# Patient Record
Sex: Female | Born: 1970 | Race: White | Hispanic: No | Marital: Married | Smoking: Never smoker
Health system: Southern US, Community
[De-identification: ages and names within clinical notes are randomized; demographics above are authoritative.]

## PROBLEM LIST (undated history)

## (undated) DIAGNOSIS — Z8489 Family history of other specified conditions: Secondary | ICD-10-CM

## (undated) DIAGNOSIS — D259 Leiomyoma of uterus, unspecified: Secondary | ICD-10-CM

## (undated) DIAGNOSIS — IMO0002 Reserved for concepts with insufficient information to code with codable children: Secondary | ICD-10-CM

## (undated) DIAGNOSIS — J3089 Other allergic rhinitis: Secondary | ICD-10-CM

## (undated) DIAGNOSIS — Z85828 Personal history of other malignant neoplasm of skin: Secondary | ICD-10-CM

## (undated) DIAGNOSIS — Z9109 Other allergy status, other than to drugs and biological substances: Secondary | ICD-10-CM

## (undated) DIAGNOSIS — F419 Anxiety disorder, unspecified: Secondary | ICD-10-CM

## (undated) DIAGNOSIS — N921 Excessive and frequent menstruation with irregular cycle: Secondary | ICD-10-CM

## (undated) DIAGNOSIS — M8580 Other specified disorders of bone density and structure, unspecified site: Secondary | ICD-10-CM

## (undated) DIAGNOSIS — T7840XA Allergy, unspecified, initial encounter: Secondary | ICD-10-CM

## (undated) DIAGNOSIS — C4491 Basal cell carcinoma of skin, unspecified: Secondary | ICD-10-CM

## (undated) HISTORY — DX: Other specified disorders of bone density and structure, unspecified site: M85.80

## (undated) HISTORY — DX: Anxiety disorder, unspecified: F41.9

## (undated) HISTORY — DX: Basal cell carcinoma of skin, unspecified: C44.91

## (undated) HISTORY — PX: COLONOSCOPY WITH PROPOFOL: SHX5780

## (undated) HISTORY — DX: Allergy, unspecified, initial encounter: T78.40XA

## (undated) HISTORY — DX: Reserved for concepts with insufficient information to code with codable children: IMO0002

## (undated) HISTORY — DX: Other allergy status, other than to drugs and biological substances: Z91.09

---

## 2000-03-23 ENCOUNTER — Inpatient Hospital Stay (HOSPITAL_COMMUNITY): Admission: AD | Admit: 2000-03-23 | Discharge: 2000-03-23 | Payer: Self-pay | Admitting: Obstetrics and Gynecology

## 2000-05-03 ENCOUNTER — Inpatient Hospital Stay (HOSPITAL_COMMUNITY): Admission: AD | Admit: 2000-05-03 | Discharge: 2000-05-05 | Payer: Self-pay | Admitting: *Deleted

## 2000-05-07 ENCOUNTER — Inpatient Hospital Stay (HOSPITAL_COMMUNITY): Admission: AD | Admit: 2000-05-07 | Discharge: 2000-05-07 | Payer: Self-pay | Admitting: *Deleted

## 2000-05-10 ENCOUNTER — Encounter: Admission: RE | Admit: 2000-05-10 | Discharge: 2000-08-08 | Payer: Self-pay | Admitting: Obstetrics and Gynecology

## 2001-07-03 ENCOUNTER — Other Ambulatory Visit: Admission: RE | Admit: 2001-07-03 | Discharge: 2001-07-03 | Payer: Self-pay | Admitting: Obstetrics and Gynecology

## 2002-07-15 ENCOUNTER — Other Ambulatory Visit: Admission: RE | Admit: 2002-07-15 | Discharge: 2002-07-15 | Payer: Self-pay | Admitting: Obstetrics and Gynecology

## 2003-08-27 ENCOUNTER — Other Ambulatory Visit: Admission: RE | Admit: 2003-08-27 | Discharge: 2003-08-27 | Payer: Self-pay | Admitting: Obstetrics and Gynecology

## 2004-10-06 ENCOUNTER — Other Ambulatory Visit: Admission: RE | Admit: 2004-10-06 | Discharge: 2004-10-06 | Payer: Self-pay | Admitting: Obstetrics and Gynecology

## 2004-10-31 DIAGNOSIS — Z8742 Personal history of other diseases of the female genital tract: Secondary | ICD-10-CM

## 2004-10-31 DIAGNOSIS — IMO0002 Reserved for concepts with insufficient information to code with codable children: Secondary | ICD-10-CM

## 2004-10-31 HISTORY — DX: Personal history of other diseases of the female genital tract: Z87.42

## 2004-10-31 HISTORY — DX: Reserved for concepts with insufficient information to code with codable children: IMO0002

## 2005-10-07 ENCOUNTER — Other Ambulatory Visit: Admission: RE | Admit: 2005-10-07 | Discharge: 2005-10-07 | Payer: Self-pay | Admitting: Obstetrics and Gynecology

## 2006-01-04 ENCOUNTER — Encounter: Admission: RE | Admit: 2006-01-04 | Discharge: 2006-01-04 | Payer: Self-pay | Admitting: Obstetrics and Gynecology

## 2007-11-21 ENCOUNTER — Other Ambulatory Visit: Admission: RE | Admit: 2007-11-21 | Discharge: 2007-11-21 | Payer: Self-pay | Admitting: Obstetrics and Gynecology

## 2007-12-05 ENCOUNTER — Encounter: Admission: RE | Admit: 2007-12-05 | Discharge: 2008-01-16 | Payer: Self-pay | Admitting: Family Medicine

## 2008-04-21 ENCOUNTER — Other Ambulatory Visit: Admission: RE | Admit: 2008-04-21 | Discharge: 2008-04-21 | Payer: Self-pay | Admitting: Obstetrics and Gynecology

## 2008-11-21 ENCOUNTER — Other Ambulatory Visit: Admission: RE | Admit: 2008-11-21 | Discharge: 2008-11-21 | Payer: Self-pay | Admitting: Obstetrics and Gynecology

## 2008-11-25 ENCOUNTER — Encounter: Admission: RE | Admit: 2008-11-25 | Discharge: 2008-11-25 | Payer: Self-pay | Admitting: Obstetrics and Gynecology

## 2009-11-24 ENCOUNTER — Other Ambulatory Visit: Admission: RE | Admit: 2009-11-24 | Discharge: 2009-11-24 | Payer: Self-pay | Admitting: Obstetrics and Gynecology

## 2010-08-02 ENCOUNTER — Encounter: Admission: RE | Admit: 2010-08-02 | Discharge: 2010-08-02 | Payer: Self-pay | Admitting: Obstetrics & Gynecology

## 2011-03-18 NOTE — Op Note (Signed)
[facility]  Patient:    Carrie Higgins, Carrie Higgins                   MRN: [id number] Proc. Date: 05/03/00 Adm. Date:  [id number] Attending:  Frederich Balding CC:         [facility]                           Operative Report  PREOPERATIVE DIAGNOSES:       Persistent occipitoposterior, second stage greater than two hours, fetal bradycardia, second degree episiotomy and fourth degree laceration.  POSTOPERATIVE DIAGNOSES:      Persistent occipitoposterior, second stage greater than two hours, fetal bradycardia, second degree episiotomy and fourth degree laceration.  PROCEDURE:                    Vacuum-extraction delivery via M-cup, repair of second degree median episiotomy, repair of fourth degree laceration.  DRAINS:                       None.  ANESTHESIA:                   Epidural.  SURGEON:                      Diana B. Thomasena Edis, M.D.  ESTIMATED BLOOD LOSS:         300 cc.  DESCRIPTION OF PROCEDURE:     The patient was pushing for over two hours with good effort.  Examination revealed there to be adequate room in the pelvis. Patient had excellent anesthesia due to her epidural.  Baby was noted to be in a persistent OP position and noted to have a fetal bradycardia to the 90s to 100s.  The patient would push with the baby crowning to the +2 to +3 station but would not deliver.  A second degree median episiotomy was performed and patient was urged to push.  The M-cup was then placed due to the bradycardia and with the first gentle pull through one-half of a contraction, was noted to make excellent descent.  The M-cup vacuum was then used for one and a half additional contractions with gentle traction and with easy delivery of the fetal vertex.  There was noted to be a fourth degree extension, even with very careful support of the perineal structures.  The infant was bulb-suctioned. There was noted to be a nuchal cord, a cord around the  left arm and a cord around the left leg which were thought to be the reason for the baby not delivering spontaneously.  The infant was bulb-suctioned, cord was doubly clamped and cut.  A cord pH was sent and found to be 7.30 and cord bloods were collected.  The placenta was removed spontaneous and intact with a three-vessel cord.  Examination revealed there to be a fourth degree laceration.  The rectal mucosa was repaired using a simple running interlocking suture of 4-0 chromic on a GI needle.  The perirectal fascia was then reapproximated using simple interrupted sutures of 4-0 chromic on a GI needle.  Careful examination of the rectum revealed there to be no stitches in the rectum.  The gloves were then changes.  The rectal sphincter ends were grasped with Allis clamps and a four-quadrant suture of 0 chromic was placed to reapproximate the sphincter, taking care to include fascia in each  bite of suture.  Excellent rectal sphincter repair was noted and patient was noted to have excellent sphincter tone.  The remainder of the repair was in the usual fashion using 3-0 Dexon.  All sponges were then removed from the vagina.  A rectal examination again revealed excellent sphincter tone.  The patient tolerated the procedure well without apparent complication.  She will be given Unasyn 3 g IV q.6h. for 24 hours as antibiotic coverage for the fourth degree repair.  She is urged to place nothing in her rectum, to use Colace 100 mg p.o. b.i.d. upon going home and a prescription was written for this; she is also urged to use Milk of Magnesia p.r.n. constipation as per bottle directions once she arrives home.  The baby was found to be a viable female, Apgars 8/9, cord pH 7.30 and was noted to be doing very well post delivery. DD:  05/04/00 TD:  05/04/00 Job: [id number]

## 2011-07-19 DIAGNOSIS — J309 Allergic rhinitis, unspecified: Secondary | ICD-10-CM | POA: Insufficient documentation

## 2011-07-19 DIAGNOSIS — F419 Anxiety disorder, unspecified: Secondary | ICD-10-CM | POA: Insufficient documentation

## 2011-10-03 DIAGNOSIS — R319 Hematuria, unspecified: Secondary | ICD-10-CM | POA: Insufficient documentation

## 2011-12-05 ENCOUNTER — Other Ambulatory Visit: Payer: Self-pay | Admitting: Obstetrics & Gynecology

## 2011-12-05 DIAGNOSIS — Z1231 Encounter for screening mammogram for malignant neoplasm of breast: Secondary | ICD-10-CM

## 2011-12-23 ENCOUNTER — Ambulatory Visit
Admission: RE | Admit: 2011-12-23 | Discharge: 2011-12-23 | Disposition: A | Discharge status: Home / Self Care | Payer: BC Managed Care – PPO | Source: Ambulatory Visit | Attending: Obstetrics & Gynecology | Admitting: Obstetrics & Gynecology

## 2011-12-23 DIAGNOSIS — Z1231 Encounter for screening mammogram for malignant neoplasm of breast: Secondary | ICD-10-CM

## 2012-11-22 ENCOUNTER — Other Ambulatory Visit: Payer: Self-pay | Admitting: Obstetrics & Gynecology

## 2012-11-22 DIAGNOSIS — Z1231 Encounter for screening mammogram for malignant neoplasm of breast: Secondary | ICD-10-CM

## 2012-12-24 ENCOUNTER — Ambulatory Visit
Admission: RE | Admit: 2012-12-24 | Discharge: 2012-12-24 | Disposition: A | Discharge status: Home / Self Care | Payer: BC Managed Care – PPO | Source: Ambulatory Visit | Attending: Obstetrics & Gynecology | Admitting: Obstetrics & Gynecology

## 2012-12-24 DIAGNOSIS — Z1231 Encounter for screening mammogram for malignant neoplasm of breast: Secondary | ICD-10-CM

## 2012-12-26 ENCOUNTER — Other Ambulatory Visit: Payer: Self-pay | Admitting: Obstetrics & Gynecology

## 2012-12-26 DIAGNOSIS — R928 Other abnormal and inconclusive findings on diagnostic imaging of breast: Secondary | ICD-10-CM

## 2013-01-07 ENCOUNTER — Ambulatory Visit
Admission: RE | Admit: 2013-01-07 | Discharge: 2013-01-07 | Disposition: A | Discharge status: Home / Self Care | Payer: BC Managed Care – PPO | Source: Ambulatory Visit | Attending: Obstetrics & Gynecology | Admitting: Obstetrics & Gynecology

## 2013-01-07 DIAGNOSIS — R928 Other abnormal and inconclusive findings on diagnostic imaging of breast: Secondary | ICD-10-CM

## 2013-01-23 ENCOUNTER — Telehealth: Payer: Self-pay | Admitting: Obstetrics & Gynecology

## 2013-01-23 NOTE — Telephone Encounter (Signed)
Pt has a question about medications that can be used to start or stop her cycle due to a trip she is taking. She is not on any birth control. The only medication that she takes regularly is Claritin. Please advise.

## 2013-01-23 NOTE — Telephone Encounter (Signed)
Unable to locate chart. Will look again in morning. Please advise. sue

## 2013-01-24 NOTE — Telephone Encounter (Signed)
This is very hard to do when a woman is not on birth control already.  Usually just causes a lot of irregular bleeding.  When is trip?

## 2013-01-24 NOTE — Telephone Encounter (Signed)
Patient notified of Dr. Hyacinth Meeker response. Patient states will be ok. She has counted on calendar and thinks will be ok. Will be starting cycle 3 to 4 days prior to leaving on trip to [place], [place] on April 12th.  Carrie Higgins

## 2013-03-01 ENCOUNTER — Encounter: Payer: Self-pay | Admitting: *Deleted

## 2013-03-04 ENCOUNTER — Encounter: Payer: Self-pay | Admitting: Obstetrics & Gynecology

## 2013-03-04 ENCOUNTER — Ambulatory Visit (INDEPENDENT_AMBULATORY_CARE_PROVIDER_SITE_OTHER): Payer: BC Managed Care – PPO | Admitting: Obstetrics & Gynecology

## 2013-03-04 VITALS — BP 104/60 | Ht 62.0 in | Wt 123.2 lb

## 2013-03-04 DIAGNOSIS — N6001 Solitary cyst of right breast: Secondary | ICD-10-CM

## 2013-03-04 DIAGNOSIS — Z0000 Encounter for general adult medical examination without abnormal findings: Secondary | ICD-10-CM

## 2013-03-04 DIAGNOSIS — N6009 Solitary cyst of unspecified breast: Secondary | ICD-10-CM

## 2013-03-04 DIAGNOSIS — Z01419 Encounter for gynecological examination (general) (routine) without abnormal findings: Secondary | ICD-10-CM

## 2013-03-04 LAB — POCT URINALYSIS DIPSTICK
Bilirubin, UA: NEGATIVE
Glucose, UA: NEGATIVE
Ketones, UA: NEGATIVE
Nitrite, UA: NEGATIVE
Protein, UA: NEGATIVE
Urobilinogen, UA: NEGATIVE
pH, UA: 8.5

## 2013-03-04 NOTE — Patient Instructions (Signed)

## 2013-03-04 NOTE — Progress Notes (Signed)
42 y.o. G1P1 MarriedCaucasianF here for annual exam.  Spotting yesterday some--feels like cycle is going to start.  She reports her cycles are like this--spotty for a day or two and then flow starts.  Flow lasts about three days.  Heavy on first real day.  Very manageable for her.  "Heavy" for her but not like what she hears other women describe.    Patient's last menstrual period was 02/05/2013.          Sexually active: yes  The current method of family planning is vasectomy.    Exercising: yes  cardio and weights Smoker:  no  Health Maintenance: Pap:  02/07/12 WNL/negative HR HPV MMG:  12/24/12 3D 01/07/13 additional views-yearly mmg  Colonoscopy:  none BMD:   Age 78-? osteopenia TDaP:  11/29/07  Labs: PCP, 12/13--all normal    reports that she has never smoked. She has never used smokeless tobacco. She reports that she drinks about 0.5 ounces of alcohol per week. She reports that she does not use illicit drugs.  Past Medical History  Diagnosis Date  . Abnormal pap 2006    History reviewed. No pertinent past surgical history.  Current Outpatient Prescriptions  Medication Sig Dispense Refill  . fluticasone (FLONASE) 50 MCG/ACT nasal spray Place 2 sprays into the nose daily.      Marland Kitchen loratadine (CLARITIN) 10 MG tablet Take 10 mg by mouth daily.      . Multiple Vitamin (MULTIVITAMIN) capsule Take 1 capsule by mouth daily.      Marland Kitchen olopatadine (PATANOL) 0.1 % ophthalmic solution 1 drop 2 (two) times daily.       No current facility-administered medications for this visit.    Family History  Problem Relation Age of Onset  . Heart disease Maternal Grandmother   . Diabetes Maternal Grandfather   . Heart disease Maternal Grandfather   . Heart disease Paternal Grandfather     ROS:  Pertinent items are noted in HPI.  Otherwise, a comprehensive ROS was negative.  Exam:   BP 104/60  Ht 5\' 2"  (1.575 m)  Wt 123 lb 3.2 oz (55.883 kg)  BMI 22.53 kg/m2  LMP 02/05/2013  Weight change: +2lb    Height: 5\' 2"  (157.5 cm)  Ht Readings from Last 3 Encounters:  03/04/13 5\' 2"  (1.575 m)    General appearance: alert, cooperative and appears stated age Head: Normocephalic, without obvious abnormality, atraumatic Neck: no adenopathy, supple, symmetrical, trachea midline and thyroid normal to inspection and palpation Lungs: clear to auscultation bilaterally Breasts: normal appearance, no masses or tenderness Heart: regular rate and rhythm Abdomen: soft, non-tender; bowel sounds normal; no masses,  no organomegaly Extremities: extremities normal, atraumatic, no cyanosis or edema Skin: Skin color, texture, turgor normal. No rashes or lesions Lymph nodes: Cervical, supraclavicular, and axillary nodes normal. No abnormal inguinal nodes palpated Neurologic: Grossly normal   Pelvic: External genitalia:  no lesions              Urethra:  normal appearing urethra with no masses, tenderness or lesions              Bartholins and Skenes: normal                 Vagina: normal appearing vagina with normal color and discharge, no lesions              Cervix: no lesions              Pap taken: no Bimanual Exam:  Uterus:  normal size, contour, position, consistency, mobility, non-tender              Adnexa: no mass, fullness, tenderness               Rectovaginal: Confirms               Anus:  normal sphincter tone, no lesions  A:  Well Woman with normal exam Breast cysts H/O microhematuria, neg eval with Dr. 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 pap smear 2013 with neg with neg HR HPV.  No Pap today. Labs with PCP this year. return annually or prn  An After Visit Summary was printed and given to the patient.

## 2013-03-06 ENCOUNTER — Ambulatory Visit: Payer: Self-pay | Admitting: Obstetrics & Gynecology

## 2013-03-08 ENCOUNTER — Ambulatory Visit: Payer: Self-pay | Admitting: Obstetrics & Gynecology

## 2013-10-22 ENCOUNTER — Telehealth: Payer: Self-pay | Admitting: Obstetrics & Gynecology

## 2013-10-22 NOTE — Telephone Encounter (Signed)
Patient has been sick for 6 weeks, URI, sinus infection, ear infection. Feeling back to normal per patient. Two days ago, felt lump between left breast and axilla. Patient wondering if lymph node. Does feel tender. Appointment scheduled for tomorrow 12/24. Patient agreeable.  Routing to provider for final review. Patient agreeable to disposition. Will close encounter

## 2013-10-22 NOTE — Telephone Encounter (Signed)
Patient feels a knot the size of a dime on her left side Below her arm pit. Says it is sore.

## 2013-10-23 ENCOUNTER — Encounter: Payer: Self-pay | Admitting: Obstetrics & Gynecology

## 2013-10-23 ENCOUNTER — Ambulatory Visit (INDEPENDENT_AMBULATORY_CARE_PROVIDER_SITE_OTHER): Payer: BC Managed Care – PPO | Admitting: Obstetrics & Gynecology

## 2013-10-23 ENCOUNTER — Telehealth: Payer: Self-pay | Admitting: Obstetrics & Gynecology

## 2013-10-23 VITALS — BP 110/64 | HR 80 | Resp 16 | Ht 62.0 in | Wt 120.0 lb

## 2013-10-23 DIAGNOSIS — N644 Mastodynia: Secondary | ICD-10-CM

## 2013-10-23 DIAGNOSIS — IMO0001 Reserved for inherently not codable concepts without codable children: Secondary | ICD-10-CM

## 2013-10-23 DIAGNOSIS — M791 Myalgia, unspecified site: Secondary | ICD-10-CM

## 2013-10-23 LAB — TSH: TSH: 2.436 u[IU]/mL (ref 0.350–4.500)

## 2013-10-23 LAB — CBC WITH DIFFERENTIAL/PLATELET
Basophils Absolute: 0 10*3/uL (ref 0.0–0.1)
Basophils Relative: 1 % (ref 0–1)
Eosinophils Absolute: 0.2 10*3/uL (ref 0.0–0.7)
Eosinophils Relative: 3 % (ref 0–5)
HCT: 40.8 % (ref 36.0–46.0)
Hemoglobin: 14.1 g/dL (ref 12.0–15.0)
Lymphocytes Relative: 28 % (ref 12–46)
Lymphs Abs: 1.6 10*3/uL (ref 0.7–4.0)
MCH: 29.7 pg (ref 26.0–34.0)
MCHC: 34.6 g/dL (ref 30.0–36.0)
MCV: 85.9 fL (ref 78.0–100.0)
Monocytes Absolute: 0.4 10*3/uL (ref 0.1–1.0)
Monocytes Relative: 8 % (ref 3–12)
Neutro Abs: 3.4 10*3/uL (ref 1.7–7.7)
Neutrophils Relative %: 60 % (ref 43–77)
Platelets: 208 10*3/uL (ref 150–400)
RBC: 4.75 MIL/uL (ref 3.87–5.11)
RDW: 13.5 % (ref 11.5–15.5)
WBC: 5.5 10*3/uL (ref 4.0–10.5)

## 2013-10-23 LAB — COMPREHENSIVE METABOLIC PANEL
ALT: 11 U/L (ref 0–35)
AST: 16 U/L (ref 0–37)
Albumin: 4 g/dL (ref 3.5–5.2)
Alkaline Phosphatase: 43 U/L (ref 39–117)
BUN: 11 mg/dL (ref 6–23)
CO2: 27 mEq/L (ref 19–32)
Calcium: 8.8 mg/dL (ref 8.4–10.5)
Chloride: 104 mEq/L (ref 96–112)
Creat: 0.72 mg/dL (ref 0.50–1.10)
Glucose, Bld: 87 mg/dL (ref 70–99)
Potassium: 3.6 mEq/L (ref 3.5–5.3)
Sodium: 138 mEq/L (ref 135–145)
Total Bilirubin: 0.5 mg/dL (ref 0.3–1.2)
Total Protein: 6.4 g/dL (ref 6.0–8.3)

## 2013-10-23 NOTE — Patient Instructions (Signed)
Please call with any new issues/problems 

## 2013-10-23 NOTE — Progress Notes (Signed)
Subjective:     Patient ID: Carrie Higgins, female   DOB: 06-03-1971, 42 y.o.   MRN: [id number]  HPI 72 G1P1 MWF here for complaint of 4 days of left upper out quadrant of breast.  No trauma.  No nipple discharge.  H/O breast cysts.  Last MMG and u/s was 3/14 and she had multiple cysts in both breasts at that time.  She is not sure that this is just "breast related".  Reports going to [place] in October.  Once coming home from [place], had an allergy flare that she still hasn't gotten under control.  She has been on a Z pak (no improvement), Levaquin (had right sided numbness), Omnicef and ear drops (allergic to ear drops), and then 2 rounds of low dose of prednisone.  Does have appointment 12/26 with ophthalmologist.    Pt reports through all of the above issues, she is having a lot of body aches, muscle aches and overall fatigue.  No fevers, weight loss, excessive bruising.  Review of Systems  Constitutional: Positive for fatigue. Negative for unexpected weight change.  Respiratory: Negative.   Cardiovascular: Negative.   Gastrointestinal: Negative.   Endocrine: Negative.   Genitourinary: Negative.   Musculoskeletal: Positive for arthralgias and myalgias.  Skin: Negative.   Neurological: Negative.   Hematological: Negative for adenopathy.  Psychiatric/Behavioral: Negative.        Objective:   Physical Exam  Constitutional: She appears well-developed and well-nourished.  Neck: Normal range of motion. Neck supple. No thyromegaly present.  Pulmonary/Chest: Right breast exhibits no inverted nipple and no mass. Left breast exhibits no inverted nipple and no mass.    Lymphadenopathy:    She has no cervical adenopathy.       Assessment:     LUOQ tenderness Body aches Recent allergy issues     Plan:     CBC with diff, CMP, TSH Recheck 4-6 weeks.  If area still present, consider u/s.

## 2013-10-23 NOTE — Telephone Encounter (Signed)
Pt was seen today and was told to schedule an appointment for 4-6 weeks re ck with Dr. Hyacinth Meeker.There are no available appointments please schedule.

## 2013-10-28 NOTE — Telephone Encounter (Signed)
Please let her know CMP, CBC with differential, and TSH normal.  Will see her again and recheck breast and will plan ultrasound if no change at recheck.

## 2013-10-28 NOTE — Telephone Encounter (Signed)
Patient notified of normal results as directed by dr Hyacinth Meeker.  She requests copy to be mailed. Advised these are available on MyChart or can sigh a release and we can give them to her.  She can sign release at 12-05-13 recheck.

## 2013-10-28 NOTE — Telephone Encounter (Signed)
Call to patient and 6 week recheck scheduled for 12-05-13. Patient asking for lab results from 10-23-13. Advised lab results can take one week. Patient states she was told they would be back on Friday. Advised office has been closed since labwork done.  Dr Hyacinth Meeker to review results and we will call her back.

## 2013-12-05 ENCOUNTER — Ambulatory Visit (INDEPENDENT_AMBULATORY_CARE_PROVIDER_SITE_OTHER): Payer: BC Managed Care – PPO | Admitting: Obstetrics & Gynecology

## 2013-12-05 ENCOUNTER — Encounter: Payer: Self-pay | Admitting: Obstetrics & Gynecology

## 2013-12-05 ENCOUNTER — Other Ambulatory Visit: Payer: Self-pay

## 2013-12-05 VITALS — BP 100/66 | HR 61 | Resp 16 | Ht 62.0 in | Wt 121.6 lb

## 2013-12-05 DIAGNOSIS — N6009 Solitary cyst of unspecified breast: Secondary | ICD-10-CM

## 2013-12-05 DIAGNOSIS — Z1231 Encounter for screening mammogram for malignant neoplasm of breast: Secondary | ICD-10-CM

## 2013-12-05 DIAGNOSIS — N644 Mastodynia: Secondary | ICD-10-CM

## 2013-12-05 NOTE — Progress Notes (Signed)
   Subjective:   43 y.o. MarriedcCaucasian female presents for follow-up of breast pain.  Pt seen 12/24 with new onset of an exquisitely tender area at 2-3 o'clock in the left breast.  Exam was normal.  Last imaging was 3/14 and was positive for multiple small breast cysts.  Recommended that she have bilateral diagnostic MMGs for 2015 exam.  Due to cystic findings and acute onset of pain (as well as negative exam), recommended conservative measures and f/u.  Patient reports pain is gone.    Patient was also having a lot of allergy issues at last visit.  This has improved.     Review of Systems A comprehensive review of systems was negative.   Objective:   General appearance: alert Head: Normocephalic, without obvious abnormality, atraumatic Neck: supple, symmetrical, trachea midline and thyroid not enlarged, symmetric, no tenderness/mass/nodules Breasts: normal appearance, no masses or tenderness, No nipple retraction or dimpling, No axillary or supraclavicular adenopathy, Normal to palpation without dominant masses    Assessment:   ASSESSMENT: breast pain, resolved, and h/o breast cysts   Plan:   PLAN: Patient will continue monthly breast self exams and proceed with imaging in March (diagnostic bilateral MMG due to cysts at last exam 3/14) FOLLOW UP prn

## 2013-12-16 DIAGNOSIS — N6009 Solitary cyst of unspecified breast: Secondary | ICD-10-CM | POA: Insufficient documentation

## 2013-12-31 ENCOUNTER — Ambulatory Visit
Admission: RE | Admit: 2013-12-31 | Discharge: 2013-12-31 | Disposition: A | Discharge status: Home / Self Care | Payer: BC Managed Care – PPO | Source: Ambulatory Visit

## 2013-12-31 DIAGNOSIS — Z1231 Encounter for screening mammogram for malignant neoplasm of breast: Secondary | ICD-10-CM

## 2014-02-11 ENCOUNTER — Ambulatory Visit (INDEPENDENT_AMBULATORY_CARE_PROVIDER_SITE_OTHER): Payer: BC Managed Care – PPO | Admitting: Obstetrics & Gynecology

## 2014-02-11 ENCOUNTER — Encounter: Payer: Self-pay | Admitting: Obstetrics & Gynecology

## 2014-02-11 VITALS — BP 100/60 | HR 64 | Resp 16 | Ht 61.75 in | Wt 122.2 lb

## 2014-02-11 DIAGNOSIS — Z0000 Encounter for general adult medical examination without abnormal findings: Secondary | ICD-10-CM

## 2014-02-11 DIAGNOSIS — R87610 Atypical squamous cells of undetermined significance on cytologic smear of cervix (ASC-US): Secondary | ICD-10-CM

## 2014-02-11 DIAGNOSIS — Z124 Encounter for screening for malignant neoplasm of cervix: Secondary | ICD-10-CM

## 2014-02-11 DIAGNOSIS — Z01419 Encounter for gynecological examination (general) (routine) without abnormal findings: Secondary | ICD-10-CM

## 2014-02-11 LAB — POCT URINALYSIS DIPSTICK
Bilirubin, UA: NEGATIVE
Glucose, UA: NEGATIVE
Ketones, UA: NEGATIVE
Nitrite, UA: NEGATIVE
Protein, UA: NEGATIVE
Urobilinogen, UA: NEGATIVE
pH, UA: 6

## 2014-02-11 MED ORDER — NORETHIN ACE-ETH ESTRAD-FE 1-20 MG-MCG PO TABS: 1.0000 | ORAL_TABLET | Freq: Every day | ORAL | Status: DC

## 2014-02-11 NOTE — Progress Notes (Signed)
43 y.o. G1P1 MarriedCaucasianF here for annual exam.  Doing well.  Breast pain has resolved.  Going on 25th anniversary trip.  Wants to be able to skip her cycle while on the trip.  Discussed risks including DVT/PE/stroke, elevated BP, nausea.  She will call with any issues.  Cycles typically regular but 25 days.  Patient's last menstrual period was 01/29/2014.          Sexually active: yes  The current method of family planning is vasectomy.    Exercising: yes  cardio and weights Smoker:  no  Health Maintenance: Pap:  02/07/12 WNL/negative HR HPV History of abnormal Pap:  Yes/repeat pap normal MMG:  12/31/13-normal Colonoscopy:  none BMD:   Age 29 TDaP:  11/29/07 Screening Labs: here in 12/14, Hb today: here in 12/14, Urine today: WBC-1+, RBC-1+, PH-6.0   reports that she has never smoked. She has never used smokeless tobacco. She reports that she drinks about .5 - 1 ounces of alcohol per week. She reports that she does not use illicit drugs.  Past Medical History  Diagnosis Date  . Abnormal pap 2006  . Basal cell carcinoma     Dr. Wilhemina Bonito  . Environmental allergies     History reviewed. No pertinent past surgical history.  Current Outpatient Prescriptions  Medication Sig Dispense Refill  . Azelastine-Fluticasone (DYMISTA) 137-50 MCG/ACT SUSP Place 1 spray into the nose daily.      . cetirizine (ZYRTEC) 10 MG tablet Take 10 mg by mouth daily.      . NON FORMULARY Allergy injections twice weekly, then will decrease to once weekly after three months      . olopatadine (PATANOL) 0.1 % ophthalmic solution 1 drop 2 (two) times daily.      . ranitidine (ZANTAC) 150 MG tablet Take 150 mg by mouth as needed for heartburn.       No current facility-administered medications for this visit.    Family History  Problem Relation Age of Onset  . Heart disease Maternal Grandmother   . Diabetes Maternal Grandfather   . Heart disease Maternal Grandfather   . Heart disease Paternal  Grandfather   . Atrial fibrillation Mother     ROS:  Pertinent items are noted in HPI.  Otherwise, a comprehensive ROS was negative.  Exam:   BP 100/60  Pulse 64  Resp 16  Ht 5' 1.75" (1.568 m)  Wt 122 lb 3.2 oz (55.43 kg)  BMI 22.55 kg/m2  LMP 01/29/2014  Weight change: +1lb  Height: 5' 1.75" (156.8 cm)  Ht Readings from Last 3 Encounters:  02/11/14 5' 1.75" (1.568 m)  12/05/13 5\' 2"  (1.575 m)  10/23/13 5\' 2"  (1.575 m)    General appearance: alert, cooperative and appears stated age Head: Normocephalic, without obvious abnormality, atraumatic Neck: no adenopathy, supple, symmetrical, trachea midline and thyroid normal to inspection and palpation Lungs: clear to auscultation bilaterally Breasts: normal appearance, no masses or tenderness Heart: regular rate and rhythm Abdomen: soft, non-tender; bowel sounds normal; no masses,  no organomegaly Extremities: extremities normal, atraumatic, no cyanosis or edema Skin: Skin color, texture, turgor normal. No rashes or lesions Lymph nodes: Cervical, supraclavicular, and axillary nodes normal. No abnormal inguinal nodes palpated Neurologic: Grossly normal   Pelvic: External genitalia:  no lesions              Urethra:  normal appearing urethra with no masses, tenderness or lesions  Bartholins and Skenes: normal                 Vagina: normal appearing vagina with normal color and discharge, no lesions              Cervix: no lesions              Pap taken: yes Bimanual Exam:  Uterus:  normal size, contour, position, consistency, mobility, non-tender              Adnexa: normal adnexa and no mass, fullness, tenderness               Rectovaginal: Confirms               Anus:  normal sphincter tone, no lesions  A:  Well Woman with normal exam Dense breast tissue H/O breast cysts  P:   Mammogram yearly pap smear only today return annually or prn  An After Visit Summary was printed and given to the patient.

## 2014-02-11 NOTE — Patient Instructions (Signed)

## 2014-02-13 LAB — IPS PAP SMEAR ONLY

## 2014-02-17 NOTE — Addendum Note (Signed)
Addended by: Megan Salon on: 02/17/2014 12:06 PM   Modules accepted: Orders

## 2014-02-18 LAB — IPS HPV ON A LIQUID BASED SPECIMEN

## 2014-02-21 ENCOUNTER — Telehealth: Payer: Self-pay | Admitting: Emergency Medicine

## 2014-02-21 DIAGNOSIS — R87810 Cervical high risk human papillomavirus (HPV) DNA test positive: Principal | ICD-10-CM

## 2014-02-21 DIAGNOSIS — R87610 Atypical squamous cells of undetermined significance on cytologic smear of cervix (ASC-US): Secondary | ICD-10-CM

## 2014-02-21 NOTE — Telephone Encounter (Signed)
Message copied by Michele Mcalpine on Fri Feb 21, 2014 11:41 AM ------      Message from: Megan Salon      Created: Fri Feb 21, 2014  7:05 AM       Inform pap is abnormal.  Needs colpo.  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  Please do not share the last part with her.  I will discuss when she comes for colposcopy.  Hers was normal two years ago so that is difficult to explain.  Thanks. ------

## 2014-02-21 NOTE — Telephone Encounter (Addendum)
Message left to return call to [place] at [phone], advised message from Dr. Sabra Heck, non urgent.    Colposcopy order pended for patient notification.  Patient on ocp.  Please see lab result note from Dr. Sabra Heck below.

## 2014-02-21 NOTE — Telephone Encounter (Signed)
Call back to patient, advised pap shows abnormal cells and additional evaluation recommended with colposcopy.  Procedure explained, patient husband with vasectomy,  (OCP just started for cycle control) colpo scheduled for 02-25-14.  Instructed to take Motrin 800 mg 1 hour prior with food.  Encounter closed.

## 2014-02-21 NOTE — Telephone Encounter (Signed)
Returning a call to Tracy °

## 2014-02-21 NOTE — Telephone Encounter (Signed)
Pt returning call

## 2014-02-24 ENCOUNTER — Telehealth: Payer: Self-pay | Admitting: Obstetrics & Gynecology

## 2014-02-24 NOTE — Telephone Encounter (Signed)
Patient is having a colposcopy tomorrow with Dr. Sabra Heck. She has some questions for the nurse prior to the appointment.

## 2014-02-24 NOTE — Telephone Encounter (Signed)
1430 Late entry: Spoke with patient. States she has several more questions about procedure scheduled with Dr. Sabra Heck tomorrow.   Questions regarding time: Advised patient that she can plan on at least one hour in office as Dr. Sabra Heck needs time with patient to discuss procedure and allow for time for paperwork, consent and set up and recovery.  Questions regarding pain/anxiety: Patient wants to know if she will be okay for travel on Thursday. Advised yes, most likely, she should be fine to travel on Thursday.  Reiterated instructions as given by Gay Filler on 4/24, motrin 600 mg with food one hour prior and that will help with cramping.   Patient would like to premedication, she states she has ativan of her own she would like to use prior to procedure. I advised that she can premedicate, but that she will have to bring a ride with her and come early to sign consents prior to taking the medication. Patient is agreeable. She states her mother will drive her and she will come one hour prior to procedure.   Routing to Dr. Sabra Heck for review prior to 4/28 procedure.

## 2014-02-25 ENCOUNTER — Ambulatory Visit (INDEPENDENT_AMBULATORY_CARE_PROVIDER_SITE_OTHER): Payer: BC Managed Care – PPO | Admitting: Obstetrics & Gynecology

## 2014-02-25 ENCOUNTER — Telehealth: Payer: Self-pay | Admitting: Obstetrics & Gynecology

## 2014-02-25 ENCOUNTER — Encounter: Payer: Self-pay | Admitting: Obstetrics & Gynecology

## 2014-02-25 VITALS — BP 102/68 | HR 76 | Temp 99.2°F | Ht 61.75 in | Wt 122.0 lb

## 2014-02-25 DIAGNOSIS — R87810 Cervical high risk human papillomavirus (HPV) DNA test positive: Secondary | ICD-10-CM

## 2014-02-25 DIAGNOSIS — R87610 Atypical squamous cells of undetermined significance on cytologic smear of cervix (ASC-US): Secondary | ICD-10-CM

## 2014-02-25 NOTE — Telephone Encounter (Signed)
Spoke with patient. She has noticed some spotting this morning. On new ocp, Junel, but still on active pills. Patient confirms she is only spotting. Advised patient if she would like to r/s that is not a problem.  Spoke with Gay Filler, okay to keep procedure as scheduled if light spotting, but can r/s if patient would like. Patient would like to try to have procedure today, advised if bleeding worsens, Dr. Sabra Heck may not be able to continue with procedure and patient is okay with that. She will come as planned.   Routing to provider for final review. Patient agreeable to disposition. Will close encounter

## 2014-02-25 NOTE — Progress Notes (Signed)
Subjective:     Patient ID: Carrie Higgins, female   DOB: Oct 08, 1971, 43 y.o.   MRN: [id number]  HPI 43 yo G1P1 MWF here for colposcopy due to ASCUS pap with + HR HPV.  Pt in long-term monogamous relationship.  Has actually never had another partner.  H/O normal pap with negative HR HPV pap two years ago.  Pt and I discussed possible causes of this pap abnormlaity--exposure, false + or false negative.  All questions answered.     Review of Systems  All other systems reviewed and are negative.      Objective:   Physical Exam  Constitutional: She is oriented to person, place, and time. She appears well-developed and well-nourished.  Genitourinary: Vagina normal.    Musculoskeletal: Normal range of motion.  Neurological: She is alert and oriented to person, place, and time.   Speculum placed.  3% acetic acid applied to cervix for >45 seconds.  Cervix visualized with both 7.5X and 15X magnification.  Green filter also used.  Lugols solution was used.  Findings:  No AWE noted but decreased Lugol's staining from 6-9 o'clock.  Biopsy:  6 and 8.  ECC:  was not performed.  Monsel's was needed.  Excellent hemostasis was present.  Pt tolerated procedure well and all instruments were removed.  Findings noted above on picture of cervix.     Assessment:     ASCUS pap with + HR HPV     Plan:     Biopsies at 6 and 8 pending.  If CIN1 or less, repeat Pap and HR HPV 1 year.  If > CIN1, plan excisional procedure.

## 2014-02-25 NOTE — Patient Instructions (Signed)

## 2014-02-25 NOTE — Telephone Encounter (Signed)
Patient has an appointment this morning for a procedure. Patient has started spotting, is this okay?

## 2014-02-25 NOTE — Progress Notes (Signed)
Pt had ASCUS pap with +HPV on pap 02-11-14. Pt had hx of abnormal pap in 2006 ASCUS -HPV. Pt took 1 of her own ativan 0.5 at 11:05am.

## 2014-02-27 LAB — IPS OTHER TISSUE BIOPSY

## 2014-02-28 ENCOUNTER — Telehealth: Payer: Self-pay | Admitting: Emergency Medicine

## 2014-02-28 NOTE — Telephone Encounter (Signed)
Message left to return call to [place] at [phone].  Advised that message was "non urgent" and to return call at her convenience.

## 2014-02-28 NOTE — Telephone Encounter (Signed)
Message copied by Michele Mcalpine on Fri Feb 28, 2014  2:56 PM ------      Message from: Megan Salon      Created: Thu Feb 27, 2014  6:35 PM       Please inform pt biopsies showed no abnormal cells.  Only inflammation was present.  She needs pap and Hr HPV testing one year. ------

## 2014-02-28 NOTE — Telephone Encounter (Signed)
Spoke with patient and message from Dr. Sabra Heck given. Patient wanted to know if biopsies did confirm HPV as she had discussed with Dr. Sabra Heck that there could be false positive results. Noted that biopsy results reported the inflammation was due to HPV changes. Patient had no further questions and is agreeable to follow up in one year and has annual exam scheduled.   08 Recall Entered.

## 2014-03-13 ENCOUNTER — Encounter: Payer: Self-pay | Admitting: Obstetrics & Gynecology

## 2014-03-13 MED ORDER — AZITHROMYCIN 250 MG PO TABS: ORAL_TABLET | ORAL | Status: DC

## 2014-03-13 NOTE — Telephone Encounter (Signed)
Rx to pharmacy for Azithromycin 1gm orally for traveler's diarrhea.  Rx for #8/0RF given.  Pt going to [place].

## 2014-05-03 ENCOUNTER — Other Ambulatory Visit: Payer: Self-pay | Admitting: Obstetrics & Gynecology

## 2014-05-05 NOTE — Telephone Encounter (Signed)
Last refilled: 02/11/14 #1 pack with 2  Refills was sent to pharmacy. Last AEX: 02/11/14  AEX Scheduled: 02/20/15 with Dr. Sabra Heck Last Mammogram: 12/31/13 BI-RADS CATEGORY 1: Negative.  Okay to refill?

## 2014-05-06 NOTE — Telephone Encounter (Signed)
No BP needed.  Only if she was going to stay on it.  Thanks for calling her.  Rx declined.

## 2014-05-06 NOTE — Telephone Encounter (Signed)
Patient notified. No problem thank you!

## 2014-05-06 NOTE — Telephone Encounter (Signed)
She needs a BP check and a phone call to make sure she is not having any new symptoms--headache, nausea.  She can check it at home if she has one and let me know.  O/W, nurse visit needed.

## 2014-05-06 NOTE — Telephone Encounter (Signed)
S/w patient she said she doesn't need a refill and that she was originally put on this medication for only 3 months for her periods since she was going on a trip. Says that she's finished the 3 months worth and doesn't plan on continuing. Patient wants to know does she still need a BP check even though she's not planning on taking this anymore?  Please advise.

## 2014-06-20 ENCOUNTER — Ambulatory Visit: Payer: BC Managed Care – PPO | Admitting: Obstetrics & Gynecology

## 2014-07-14 ENCOUNTER — Ambulatory Visit (INDEPENDENT_AMBULATORY_CARE_PROVIDER_SITE_OTHER): Payer: BC Managed Care – PPO | Admitting: Certified Nurse Midwife

## 2014-07-14 ENCOUNTER — Encounter: Payer: Self-pay | Admitting: Certified Nurse Midwife

## 2014-07-14 VITALS — BP 100/60 | HR 64 | Temp 98.1°F | Resp 16 | Ht 61.75 in | Wt 125.0 lb

## 2014-07-14 DIAGNOSIS — N390 Urinary tract infection, site not specified: Secondary | ICD-10-CM

## 2014-07-14 DIAGNOSIS — B373 Candidiasis of vulva and vagina: Secondary | ICD-10-CM

## 2014-07-14 DIAGNOSIS — B3731 Acute candidiasis of vulva and vagina: Secondary | ICD-10-CM

## 2014-07-14 LAB — POCT URINALYSIS DIPSTICK
Bilirubin, UA: NEGATIVE
Glucose, UA: NEGATIVE
Ketones, UA: NEGATIVE
Nitrite, UA: NEGATIVE
Protein, UA: NEGATIVE
Urobilinogen, UA: NEGATIVE
pH, UA: 7

## 2014-07-14 MED ORDER — FLUCONAZOLE 150 MG PO TABS: ORAL_TABLET | ORAL | Status: DC

## 2014-07-14 MED ORDER — NYSTATIN-TRIAMCINOLONE 100000-0.1 UNIT/GM-% EX CREA: 1.0000 "application " | TOPICAL_CREAM | Freq: Two times a day (BID) | CUTANEOUS | Status: DC

## 2014-07-14 NOTE — Progress Notes (Signed)
89 married white female [id number] here with complaint of UTI, with onset one week ago. Patient complaining of urinary /urgency/ and pain with urination when touches skin.. Patient denies fever, chills, nausea or back pain. No new personal products. Patient feels not related to sexual activity. Denies any vaginal symptoms but has noted some discomfort with sexual activity.No new personal products. Contraception is vasectomy.Jenetta Downer: Healthy female WDWN Affect: Normal, orientation x 3 Skin : warm and dry CVAT: negative bilateral Abdomen: slight positive for pressure only not for suprapubic tenderness  Pelvic exam: External genital area: normal, no lesions, Vulva red, scaly appearance with exudate wet prep taken Bladder,Urethr not tender, Urethral meatus: tender, slightly red Vagina: scant whitel discharge, normal appearance  Wet prep taken, ph 4.0 Cervix: normal, non tender Uterus:normal,non tender Adnexa: normal non tender, no fullness or masses Wet prep positive for yeast vaginal/vulva  A:Yeast vaginitis/vulvitis  R/O UTI  Poc urine-ph 7.0, rbc tr, wbc-tr  P: Reviewed findings of yeast vaginitis/vulvitis and probably also contributing to burning on skin and ? UTI symptoms. GD:[id number] see order Rx Mycolog cream see order Aveeno sitz bath comfort  prn HDQ:QIWLN micro, culture  Reviewed warning signs and symptoms of UTI and need to advise if symptoms increased. Encouraged to limit soda, tea, and coffee  Rv prn

## 2014-07-14 NOTE — Patient Instructions (Signed)
Urinary Tract Infection Urinary tract infections (UTIs) can develop anywhere along your urinary tract. Your urinary tract is your body's drainage system for removing wastes and extra water. Your urinary tract includes two kidneys, two ureters, a bladder, and a urethra. Your kidneys are a pair of bean-shaped organs. Each kidney is about the size of your fist. They are located below your ribs, one on each side of your spine. CAUSES Infections are caused by microbes, which are microscopic organisms, including fungi, viruses, and bacteria. These organisms are so small that they can only be seen through a microscope. Bacteria are the microbes that most commonly cause UTIs. SYMPTOMS  Symptoms of UTIs may vary by age and gender of the patient and by the location of the infection. Symptoms in young women typically include a frequent and intense urge to urinate and a painful, burning feeling in the bladder or urethra during urination. Older women and men are more likely to be tired, shaky, and weak and have muscle aches and abdominal pain. A fever may mean the infection is in your kidneys. Other symptoms of a kidney infection include pain in your back or sides below the ribs, nausea, and vomiting. DIAGNOSIS To diagnose a UTI, your caregiver will ask you about your symptoms. Your caregiver also will ask to provide a urine sample. The urine sample will be tested for bacteria and white blood cells. White blood cells are made by your body to help fight infection. TREATMENT  Typically, UTIs can be treated with medication. Because most UTIs are caused by a bacterial infection, they usually can be treated with the use of antibiotics. The choice of antibiotic and length of treatment depend on your symptoms and the type of bacteria causing your infection. HOME CARE INSTRUCTIONS  If you were prescribed antibiotics, take them exactly as your caregiver instructs you. Finish the medication even if you feel better after you  have only taken some of the medication.  Drink enough water and fluids to keep your urine clear or pale yellow.  Avoid caffeine, tea, and carbonated beverages. They tend to irritate your bladder.  Empty your bladder often. Avoid holding urine for long periods of time.  Empty your bladder before and after sexual intercourse.  After a bowel movement, women should cleanse from front to back. Use each tissue only once. SEEK MEDICAL CARE IF:   You have back pain.  You develop a fever.  Your symptoms do not begin to resolve within 3 days. SEEK IMMEDIATE MEDICAL CARE IF:   You have severe back pain or lower abdominal pain.  You develop chills.  You have nausea or vomiting.  You have continued burning or discomfort with urination. MAKE SURE YOU:   Understand these instructions.  Will watch your condition.  Will get help right away if you are not doing well or get worse. Document Released: 07/27/2005 Document Revised: 04/17/2012 Document Reviewed: 11/25/2011 [facility] Patient Information 2015 [place], [place]. This information is not intended to replace advice given to you by your health care provider. Make sure you discuss any questions you have with your health care provider. Candida Infection A Candida infection (also called yeast, fungus, and Monilia infection) is an overgrowth of yeast that can occur anywhere on the body. A yeast infection commonly occurs in warm, moist body areas. Usually, the infection remains localized but can spread to become a systemic infection. A yeast infection may be a sign of a more severe disease such as diabetes, leukemia, or AIDS. A yeast  infection can occur in both men and women. In women, Candida vaginitis is a vaginal infection. It is one of the most common causes of vaginitis. Men usually do not have symptoms or know they have an infection until other problems develop. Men may find out they have a yeast infection because their sex partner has a yeast  infection. Uncircumcised men are more likely to get a yeast infection than circumcised men. This is because the uncircumcised glans is not exposed to air and does not remain as dry as that of a circumcised glans. Older adults may develop yeast infections around dentures. CAUSES  Women  Antibiotics.  Steroid medication taken for a long time.  Being overweight (obese).  Diabetes.  Poor immune condition.  Certain serious medical conditions.  Immune suppressive medications for organ transplant patients.  Chemotherapy.  Pregnancy.  Menstruation.  Stress and fatigue.  Intravenous drug use.  Oral contraceptives.  Wearing tight-fitting clothes in the crotch area.  Catching it from a sex partner who has a yeast infection.  Spermicide.  Intravenous, urinary, or other catheters. Men  Catching it from a sex partner who has a yeast infection.  Having oral or anal sex with a person who has the infection.  Spermicide.  Diabetes.  Antibiotics.  Poor immune system.  Medications that suppress the immune system.  Intravenous drug use.  Intravenous, urinary, or other catheters. SYMPTOMS  Women  Thick, white vaginal discharge.  Vaginal itching.  Redness and swelling in and around the vagina.  Irritation of the lips of the vagina and perineum.  Blisters on the vaginal lips and perineum.  Painful sexual intercourse.  Low blood sugar (hypoglycemia).  Painful urination.  Bladder infections.  Intestinal problems such as constipation, indigestion, bad breath, bloating, increase in gas, diarrhea, or loose stools. Men  Men may develop intestinal problems such as constipation, indigestion, bad breath, bloating, increase in gas, diarrhea, or loose stools.  Dry, cracked skin on the penis with itching or discomfort.  Jock itch.  Dry, flaky skin.  Athlete's foot.  Hypoglycemia. DIAGNOSIS  Women  A history and an exam are performed.  The discharge may be  examined under a microscope.  A culture may be taken of the discharge. Men  A history and an exam are performed.  Any discharge from the penis or areas of cracked skin will be looked at under the microscope and cultured.  Stool samples may be cultured. TREATMENT  Women  Vaginal antifungal suppositories and creams.  Medicated creams to decrease irritation and itching on the outside of the vagina.  Warm compresses to the perineal area to decrease swelling and discomfort.  Oral antifungal medications.  Medicated vaginal suppositories or cream for repeated or recurrent infections.  Wash and dry the irritation areas before applying the cream.  Eating yogurt with Lactobacillus may help with prevention and treatment.  Sometimes painting the vagina with gentian violet solution may help if creams and suppositories do not work. Men  Antifungal creams and oral antifungal medications.  Sometimes treatment must continue for 30 days after the symptoms go away to prevent recurrence. HOME CARE INSTRUCTIONS  Women  Use cotton underwear and avoid tight-fitting clothing.  Avoid colored, scented toilet paper and deodorant tampons or pads.  Do not douche.  Keep your diabetes under control.  Finish all the prescribed medications.  Keep your skin clean and dry.  Consume milk or yogurt with Lactobacillus-active culture regularly. If you get frequent yeast infections and think that is what the infection  is, there are over-the-counter medications that you can get. If the infection does not show healing in 3 days, talk to your caregiver.  Tell your sex partner you have a yeast infection. Your partner may need treatment also, especially if your infection does not clear up or recurs. Men  Keep your skin clean and dry.  Keep your diabetes under control.  Finish all prescribed medications.  Tell your sex partner that you have a yeast infection so he or she can be treated if  necessary. SEEK MEDICAL CARE IF:   Your symptoms do not clear up or worsen in one week after treatment.  You have an oral temperature above 102 F (38.9 C).  You have trouble swallowing or eating for a prolonged time.  You develop blisters on and around your vagina.  You develop vaginal bleeding and it is not your menstrual period.  You develop abdominal pain.  You develop intestinal problems as mentioned above.  You get weak or light-headed.  You have painful or increased urination.  You have pain during sexual intercourse. MAKE SURE YOU:   Understand these instructions.  Will watch your condition.  Will get help right away if you are not doing well or get worse. Document Released: 11/24/2004 Document Revised: 03/03/2014 Document Reviewed: 03/08/2010 [facility] Patient Information 2015 [place], [place]. This information is not intended to replace advice given to you by your health care provider. Make sure you discuss any questions you have with your health care provider.

## 2014-07-15 ENCOUNTER — Encounter: Payer: Self-pay | Admitting: Certified Nurse Midwife

## 2014-07-15 LAB — URINALYSIS, MICROSCOPIC ONLY
Bacteria, UA: NONE SEEN
Casts: NONE SEEN
Crystals: NONE SEEN
Squamous Epithelial / LPF: NONE SEEN

## 2014-07-16 ENCOUNTER — Telehealth: Payer: Self-pay | Admitting: Certified Nurse Midwife

## 2014-07-16 LAB — URINE CULTURE
Colony Count: NO GROWTH
Organism ID, Bacteria: NO GROWTH

## 2014-07-16 NOTE — Telephone Encounter (Signed)
no

## 2014-07-16 NOTE — Telephone Encounter (Signed)
Pt calling for urine test results.

## 2014-07-16 NOTE — Telephone Encounter (Signed)
Spoke with patient. Advised per what I can see with urine results from 9/14 everything is normal. Patient would like to know if she can use tampons while treating for yeast. Advised patient it is fine to use a tampon with cycle but would recommend pad. Patient would like to know if she can use mycolog with her cycle. Advised she could use this as well. Patient was also treated with diflucan. Patient agreeable and verbalizes understanding.  Regina Eck CNM, Anything further for this patient?

## 2014-07-18 ENCOUNTER — Telehealth: Payer: Self-pay | Admitting: Obstetrics & Gynecology

## 2014-07-18 NOTE — Telephone Encounter (Signed)
Spoke with patient and advised of results. Patient states she feels much better, advised would notifiy Regina Eck CNM of improvement.  Routing to provider for final review. Patient agreeable to disposition. Will close encounter

## 2014-07-18 NOTE — Telephone Encounter (Signed)
PT calling saying someone just called her from our office but didn't leave a message. i don't see anything anywhere.

## 2014-07-18 NOTE — Telephone Encounter (Signed)
Left message to call [place] at [phone].   Notes Recorded by Olga Millers, [place] on 07/18/2014 at 9:41 AM LM on patient's VM in regards to lab results (# designated on [facility] [phone] also confimed first and last night) Notes Recorded by Regina Eck, CNM on 07/16/2014 at 7:03 PM Notify patient that Urine micro and culture negative

## 2014-07-21 NOTE — Progress Notes (Signed)
Reviewed personally.  M. Suzanne Derrich Gaby, MD.  

## 2014-09-01 ENCOUNTER — Encounter: Payer: Self-pay | Admitting: Certified Nurse Midwife

## 2014-12-02 ENCOUNTER — Other Ambulatory Visit: Payer: Self-pay

## 2014-12-02 DIAGNOSIS — Z1231 Encounter for screening mammogram for malignant neoplasm of breast: Secondary | ICD-10-CM

## 2015-01-06 ENCOUNTER — Ambulatory Visit
Admission: RE | Admit: 2015-01-06 | Discharge: 2015-01-06 | Disposition: A | Discharge status: Home / Self Care | Payer: BLUE CROSS/BLUE SHIELD | Source: Ambulatory Visit

## 2015-01-06 DIAGNOSIS — Z1231 Encounter for screening mammogram for malignant neoplasm of breast: Secondary | ICD-10-CM

## 2015-01-14 ENCOUNTER — Other Ambulatory Visit: Payer: Self-pay | Admitting: Obstetrics & Gynecology

## 2015-01-14 DIAGNOSIS — N63 Unspecified lump in unspecified breast: Secondary | ICD-10-CM

## 2015-01-20 ENCOUNTER — Ambulatory Visit
Admission: RE | Admit: 2015-01-20 | Discharge: 2015-01-20 | Disposition: A | Discharge status: Home / Self Care | Payer: BLUE CROSS/BLUE SHIELD | Source: Ambulatory Visit | Attending: Obstetrics & Gynecology | Admitting: Obstetrics & Gynecology

## 2015-01-20 DIAGNOSIS — N63 Unspecified lump in unspecified breast: Secondary | ICD-10-CM

## 2015-02-19 ENCOUNTER — Encounter: Payer: Self-pay | Admitting: Obstetrics & Gynecology

## 2015-02-19 ENCOUNTER — Ambulatory Visit (INDEPENDENT_AMBULATORY_CARE_PROVIDER_SITE_OTHER): Payer: BLUE CROSS/BLUE SHIELD | Admitting: Obstetrics & Gynecology

## 2015-02-19 VITALS — BP 100/60 | HR 56 | Resp 12 | Ht 62.0 in | Wt 123.2 lb

## 2015-02-19 DIAGNOSIS — Z01419 Encounter for gynecological examination (general) (routine) without abnormal findings: Secondary | ICD-10-CM

## 2015-02-19 DIAGNOSIS — B977 Papillomavirus as the cause of diseases classified elsewhere: Secondary | ICD-10-CM | POA: Diagnosis not present

## 2015-02-19 DIAGNOSIS — Z124 Encounter for screening for malignant neoplasm of cervix: Secondary | ICD-10-CM | POA: Diagnosis not present

## 2015-02-19 NOTE — Addendum Note (Signed)
Addended by: Megan Salon on: 02/19/2015 12:02 PM   Modules accepted: Miquel Dunn

## 2015-02-19 NOTE — Progress Notes (Signed)
44 y.o. G1P1 MarriedCaucasianF here for annual exam.  Doing well.  Regular cycles.    H/O ASCUS pap with +HR HPV 2015.  Did have colposcopy showing cervicitis with HPV related changes.  PCP:  Dr. Frederico Hamman at [facility]  Patient's last menstrual period was 01/25/2015.          Sexually active: Yes.    The current method of family planning is vasectomy.    Exercising: Yes.    cardio and weights Smoker:  no  Health Maintenance: Pap:  02/11/14 ASCUS/positive HR HPV, 02/25/14 colposcopy-negative, inflammation History of abnormal Pap:  yes MMG:  01/20/15 3D MMG/US-normal, screening in one year Colonoscopy:  none BMD:   Age 89-osteopenia TDaP:  11/29/07 Screening Labs: PCP, Hb today: PCP, Urine today: PCP   reports that she has never smoked. She has never used smokeless tobacco. She reports that she drinks about 0.5 - 1.0 oz of alcohol per week. She reports that she does not use illicit drugs.  Past Medical History  Diagnosis Date  . Abnormal pap 2006    2006 ASCUS - HPV, 02-11-14 ASCUS +HPV  . Basal cell carcinoma     Dr. Wilhemina Bonito  . Environmental allergies     No past surgical history on file.  Current Outpatient Prescriptions  Medication Sig Dispense Refill  . Azelastine-Fluticasone (DYMISTA) 137-50 MCG/ACT SUSP Place 1 spray into the nose daily.    Marland Kitchen CIPRODEX otic suspension As needed  0  . levocetirizine (XYZAL) 5 MG tablet Take 5 mg by mouth every evening.    Marland Kitchen LORazepam (ATIVAN) 0.5 MG tablet Take 0.5 mg by mouth as needed for anxiety.    . NON FORMULARY Allergy injections twice weekly, then will decrease to once weekly after three months     No current facility-administered medications for this visit.    Family History  Problem Relation Age of Onset  . Heart disease Maternal Grandmother   . Diabetes Maternal Grandfather   . Heart disease Maternal Grandfather   . Heart disease Paternal Grandfather   . Atrial fibrillation Mother     ROS:  Pertinent items are noted  in HPI.  Otherwise, a comprehensive ROS was negative.  Exam:   General appearance: alert, cooperative and appears stated age Head: Normocephalic, without obvious abnormality, atraumatic Neck: no adenopathy, supple, symmetrical, trachea midline and thyroid normal to inspection and palpation Lungs: clear to auscultation bilaterally Breasts: normal appearance, no masses or tenderness Heart: regular rate and rhythm Abdomen: soft, non-tender; bowel sounds normal; no masses,  no organomegaly Extremities: extremities normal, atraumatic, no cyanosis or edema Skin: Skin color, texture, turgor normal. No rashes or lesions Lymph nodes: Cervical, supraclavicular, and axillary nodes normal. No abnormal inguinal nodes palpated Neurologic: Grossly normal   Pelvic: External genitalia:  no lesions              Urethra:  normal appearing urethra with no masses, tenderness or lesions              Bartholins and Skenes: normal                 Vagina: normal appearing vagina with normal color and discharge, no lesions              Cervix: no lesions              Pap taken: Yes.   Bimanual Exam:  Uterus:  normal size, contour, position, consistency, mobility, non-tender  Adnexa: normal adnexa and no mass, fullness, tenderness               Rectovaginal: Confirms               Anus:  normal sphincter tone, no lesions  Chaperone was present for exam.  A:  Well Woman with normal exam Dense breast tissue H/O breast cysts H/O ASCUS pap with + HR HPV 4-15.  Colposcopy 4/15 showed HPV changes only  P: Mammogram yearly.  Doing 3D MMG. pap smear with HR HPV testing.  Pt aware if either of these is abnormal she will need a repeat colposcopy Labs were all normal with Dr. Frederico Hamman last year return annually or prn

## 2015-02-20 ENCOUNTER — Ambulatory Visit: Payer: BC Managed Care – PPO | Admitting: Obstetrics & Gynecology

## 2015-02-24 LAB — IPS PAP TEST WITH HPV

## 2015-02-25 ENCOUNTER — Telehealth: Payer: Self-pay

## 2015-02-25 ENCOUNTER — Telehealth: Payer: Self-pay | Admitting: Obstetrics & Gynecology

## 2015-02-25 ENCOUNTER — Other Ambulatory Visit: Payer: Self-pay

## 2015-02-25 DIAGNOSIS — IMO0002 Reserved for concepts with insufficient information to code with codable children: Secondary | ICD-10-CM

## 2015-02-25 NOTE — Telephone Encounter (Signed)
Patient notified of results. Aware will precert the procedure and call with benefits and to schedule.//kn

## 2015-02-25 NOTE — Telephone Encounter (Signed)
Call to patient. Advised of benefit quote received for colpo.  Patient agreeable. Scheduled procedure.

## 2015-02-26 ENCOUNTER — Encounter: Payer: Self-pay | Admitting: Obstetrics & Gynecology

## 2015-02-26 NOTE — Telephone Encounter (Signed)
Called patient. She has concerns about appointment day and cycle. LMP 02/21/15. Vasectomy for birth control.  Appointment changed to 03/09/15 with 1530 with Dr. Sabra Heck. Patient agreeable. Routing to provider for final review. Patient agreeable to disposition. Will close encounter

## 2015-03-04 ENCOUNTER — Encounter: Payer: Self-pay | Admitting: Obstetrics & Gynecology

## 2015-03-04 NOTE — Telephone Encounter (Signed)
Patient answered via mychart.  Will hold for cancellations.

## 2015-03-09 ENCOUNTER — Telehealth: Payer: Self-pay | Admitting: Obstetrics & Gynecology

## 2015-03-09 ENCOUNTER — Ambulatory Visit: Payer: BLUE CROSS/BLUE SHIELD | Admitting: Obstetrics & Gynecology

## 2015-03-09 NOTE — Telephone Encounter (Signed)
Spoke with patient to cancel procedure today and will have nurse call her back to reschedule.

## 2015-03-09 NOTE — Telephone Encounter (Signed)
Spoke with patient. Appointment rescheduled for tomorrow at 11:30am with Dr.Miller. Day and time per Gay Filler. Patient is agreeable to date and time. Currently method of OCP is vasectomy.   Routing to provider for final review. Patient agreeable to disposition. Patient aware provider will review message and nurse will return call with any additional instructions or change of disposition. Will close encounter.

## 2015-03-10 ENCOUNTER — Ambulatory Visit (INDEPENDENT_AMBULATORY_CARE_PROVIDER_SITE_OTHER): Payer: BLUE CROSS/BLUE SHIELD | Admitting: Obstetrics & Gynecology

## 2015-03-10 DIAGNOSIS — R896 Abnormal cytological findings in specimens from other organs, systems and tissues: Secondary | ICD-10-CM

## 2015-03-10 DIAGNOSIS — IMO0002 Reserved for concepts with insufficient information to code with codable children: Secondary | ICD-10-CM | POA: Insufficient documentation

## 2015-03-10 NOTE — Progress Notes (Signed)
44 y.o. Married Not Hispanic or Latino female here for colposcopy with possible biopsies and/or ECC due to ASCUS pap with +HR HPV Pap obtained at [facility] on 02/19/15.  Pt had same pap and same positive HR HPV 1 year ago.  Pt had a colposcpoy last year on 02/25/14 showing .    Patient's last menstrual period was 02/19/2015.          Sexually active: Yes.    The current method of family planning is vasectomy.     Patient has been counseled about results and procedure.  Risks and benefits have bene reviewed including immediate and/or delayed bleeding, infection, cervical scaring from procedure, possibility of needing additional follow up as well as treatment.  rare risks of missing a lesion discussed as well.  All questions answered.  Pt ready to proceed.  BP 102/62 mmHg  Pulse 64  Resp 16  Wt 123 lb 9.6 oz (56.065 kg)  LMP 02/19/2015  General appearance: alert, cooperative and appears stated age Head: Normocephalic, without obvious abnormality, atraumatic Neurologic: Grossly normal  Pelvic: External genitalia:  no lesions              Urethra:  normal appearing urethra with no masses, tenderness or lesions              Bartholins and Skenes: normal                 Vagina: normal appearing vagina with normal color and discharge, no lesions              Cervix: no lesions              Pap taken: No.  Speculum placed.  3% acetic acid applied to cervix for >45 seconds.  Cervix visualized with both 7.5X and 15X magnification.  Green filter also used.  Lugols solution was not used.  Findings:  Increased vascularity at 6-8 o'clock on cervix with small area of mosaicism at 7 o'clock.  As well AWE noted at 11 o'clock.  Biopsy:  7 and 11 o'clock.  ECC:  was performed.  Monsel's was needed.  Excellent hemostasis was present.  Pt tolerated procedure well and all instruments were removed.  Findings noted above on picture of cervix.  Assessment:  ASCUS pap with +HR HPV  Plan:  Pathology results will be called  to patient and follow-up planned pending results.

## 2015-03-10 NOTE — Addendum Note (Signed)
Addended by: Megan Salon on: 03/10/2015 12:43 PM   Modules accepted: Orders, Level of Service

## 2015-03-12 LAB — IPS OTHER TISSUE BIOPSY

## 2015-03-16 ENCOUNTER — Telehealth: Payer: Self-pay | Admitting: Emergency Medicine

## 2015-03-16 NOTE — Telephone Encounter (Signed)
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  Stable from one year ago.  Repeat 1 year with HR HPV testing.  Out of current recall (if still in it) and needs new 08 for 1 year.

## 2015-03-16 NOTE — Telephone Encounter (Signed)
Spoke with patient and message from Dr. Sabra Heck given. Patient agreeable and verbalized understanding of results.  08 recall in placed. Patient is scheduled for annual exam 05/12/16.   Routing to provider for final review. Patient agreeable to disposition. Will close encounter.    Carrie Higgins

## 2015-03-19 ENCOUNTER — Ambulatory Visit: Payer: BLUE CROSS/BLUE SHIELD | Admitting: Obstetrics & Gynecology

## 2015-08-05 ENCOUNTER — Telehealth: Payer: Self-pay | Admitting: Obstetrics and Gynecology

## 2015-08-05 ENCOUNTER — Encounter: Payer: Self-pay | Admitting: Certified Nurse Midwife

## 2015-08-05 ENCOUNTER — Ambulatory Visit (INDEPENDENT_AMBULATORY_CARE_PROVIDER_SITE_OTHER): Payer: BLUE CROSS/BLUE SHIELD | Admitting: Certified Nurse Midwife

## 2015-08-05 VITALS — BP 114/70 | HR 68 | Temp 98.6°F | Resp 16 | Ht 62.0 in | Wt 125.0 lb

## 2015-08-05 DIAGNOSIS — N390 Urinary tract infection, site not specified: Secondary | ICD-10-CM

## 2015-08-05 DIAGNOSIS — R319 Hematuria, unspecified: Secondary | ICD-10-CM

## 2015-08-05 DIAGNOSIS — B373 Candidiasis of vulva and vagina: Secondary | ICD-10-CM | POA: Diagnosis not present

## 2015-08-05 DIAGNOSIS — B3731 Acute candidiasis of vulva and vagina: Secondary | ICD-10-CM

## 2015-08-05 LAB — POCT URINALYSIS DIPSTICK
Bilirubin, UA: NEGATIVE
Glucose, UA: NEGATIVE
Ketones, UA: NEGATIVE
Nitrite, UA: NEGATIVE
Protein, UA: NEGATIVE
Urobilinogen, UA: NEGATIVE
pH, UA: 5

## 2015-08-05 MED ORDER — NYSTATIN-TRIAMCINOLONE 100000-0.1 UNIT/GM-% EX CREA: 1.0000 "application " | TOPICAL_CREAM | Freq: Two times a day (BID) | CUTANEOUS | Status: DC

## 2015-08-05 MED ORDER — NITROFURANTOIN MONOHYD MACRO 100 MG PO CAPS: 100.0000 mg | ORAL_CAPSULE | Freq: Two times a day (BID) | ORAL | Status: DC

## 2015-08-05 NOTE — Progress Notes (Signed)
Reviewed personally.  M. Suzanne Brittish Bolinger, MD.  

## 2015-08-05 NOTE — Progress Notes (Signed)
44 y.o.Married white female g1p1001 here with complaint of vaginal symptoms of itching, burning external, and scant  increase discharge. Describes discharge as normal white. Onset of symptoms about 2 weeks ago. Denies new personal products.. Urinary symptoms of burning and urinary pressure. Denies fever, chills or backache or nausea.  Contraception is spouse vasectomy. Patient has noted a spray of urine which she empties, which has been her normal, but has feeling of dampness after emptying. Does work out and stays in work out clothes at times. No other health issues today.   O:Healthy female WDWN Affect: normal, orientation x 3  Exam:Skin warm and dry Abdomen:soft,positive suprapubic pain CVAT bilateral negative Lymph node: no enlargement or tenderness Pelvic exam: External genital: normal female, with scaling and redness at fourchette and perineal area. BUS: negative Bladder tender, urethral meatus tender Vagina: normal discharge noted. Ph:4.5    Affirm taken Cervix: normal, non tender, no CMT Uterus: normal, non tender Adnexa:normal, non tender, no masses or fullness noted   Wet Prep results: positive for yeast external only Poct urine- rbc 1+, wbc 1+  A:Normal pelvic exam UTI Yeast vulvitis    P:Discussed findings of UTI and yeast vulvitis and etiology. Discussed Aveeno or baking soda sitz bath for comfort. Avoid moist clothes or pads for extended period of time. If working out in gym clothes or swim suits for long periods of time change underwear or bottoms of swimsuit if possible. Coconut Oil use for skin protection prior to activity can be used to external skin.Questions addressed. Lab: Urine micro Rx: Macrobid with instructions Rx Mycolog cream  Increase water intake and decrease soda and tea intake. Discussed urinary retention and ? Leakage evaluation with possibility if no change after treatment. Patient will advise of symptoms if persist.  Rv prn

## 2015-08-05 NOTE — Telephone Encounter (Signed)
Patient has been seen this evening.  Encounter closed.

## 2015-08-05 NOTE — Patient Instructions (Signed)

## 2015-08-05 NOTE — Telephone Encounter (Signed)
Patient no showed today's appointment attempted to [facility] on patient's voicemail but no voicemail box has been set up.

## 2015-08-06 LAB — URINALYSIS, MICROSCOPIC ONLY
Casts: NONE SEEN [LPF]
Crystals: NONE SEEN [HPF]
RBC / HPF: NONE SEEN RBC/HPF (ref <=2)
Yeast: NONE SEEN [HPF]

## 2015-08-06 LAB — WET PREP BY MOLECULAR PROBE
Candida species: NEGATIVE
Gardnerella vaginalis: NEGATIVE
Trichomonas vaginosis: NEGATIVE

## 2015-12-17 ENCOUNTER — Other Ambulatory Visit: Payer: Self-pay

## 2015-12-17 DIAGNOSIS — Z1231 Encounter for screening mammogram for malignant neoplasm of breast: Secondary | ICD-10-CM

## 2016-02-02 ENCOUNTER — Ambulatory Visit
Admission: RE | Admit: 2016-02-02 | Discharge: 2016-02-02 | Disposition: A | Discharge status: Home / Self Care | Payer: BLUE CROSS/BLUE SHIELD | Source: Ambulatory Visit

## 2016-02-02 DIAGNOSIS — Z1231 Encounter for screening mammogram for malignant neoplasm of breast: Secondary | ICD-10-CM

## 2016-05-10 ENCOUNTER — Encounter: Payer: Self-pay | Admitting: Obstetrics & Gynecology

## 2016-05-12 ENCOUNTER — Ambulatory Visit: Payer: BLUE CROSS/BLUE SHIELD | Admitting: Obstetrics & Gynecology

## 2016-05-16 ENCOUNTER — Encounter: Payer: Self-pay | Admitting: Obstetrics & Gynecology

## 2016-05-16 ENCOUNTER — Ambulatory Visit (INDEPENDENT_AMBULATORY_CARE_PROVIDER_SITE_OTHER): Payer: BLUE CROSS/BLUE SHIELD | Admitting: Obstetrics & Gynecology

## 2016-05-16 VITALS — BP 92/56 | HR 60 | Resp 14 | Ht 62.0 in | Wt 125.6 lb

## 2016-05-16 DIAGNOSIS — Z0000 Encounter for general adult medical examination without abnormal findings: Secondary | ICD-10-CM

## 2016-05-16 DIAGNOSIS — Z01419 Encounter for gynecological examination (general) (routine) without abnormal findings: Secondary | ICD-10-CM

## 2016-05-16 DIAGNOSIS — N6310 Unspecified lump in the right breast, unspecified quadrant: Secondary | ICD-10-CM

## 2016-05-16 DIAGNOSIS — Z124 Encounter for screening for malignant neoplasm of cervix: Secondary | ICD-10-CM | POA: Diagnosis not present

## 2016-05-16 DIAGNOSIS — N63 Unspecified lump in breast: Secondary | ICD-10-CM | POA: Diagnosis not present

## 2016-05-16 DIAGNOSIS — N6320 Unspecified lump in the left breast, unspecified quadrant: Secondary | ICD-10-CM

## 2016-05-16 LAB — POCT URINALYSIS DIPSTICK
Bilirubin, UA: NEGATIVE
Blood, UA: NEGATIVE
Glucose, UA: NEGATIVE
Ketones, UA: NEGATIVE
Leukocytes, UA: NEGATIVE
Nitrite, UA: NEGATIVE
Protein, UA: NEGATIVE
Urobilinogen, UA: NEGATIVE
pH, UA: 5

## 2016-05-16 NOTE — Progress Notes (Signed)
45 y.o. G1P1 Married Caucasian F here for annual exam.    Patient's last menstrual period was 05/09/2016.          Sexually active: Yes.    The current method of family planning is vasectomy.    Exercising: Yes.    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 She has never used smokeless tobacco. She reports that she drinks about 0.5 - 1.0 oz of alcohol per week. She reports that she does not use illicit drugs.  Past Medical History  Diagnosis Date  . Abnormal pap 2006    2006 ASCUS - HPV, 02-11-14 ASCUS +HPV  . Basal cell carcinoma     Dr. Wilhemina Bonito  . Environmental allergies   . Osteopenia     BMD age 49    No past surgical history on file.  Current Outpatient Prescriptions  Medication Sig Dispense Refill  . Azelastine-Fluticasone (DYMISTA) 137-50 MCG/ACT SUSP Place 1 spray into the nose daily.    Marland Kitchen levocetirizine (XYZAL) 5 MG tablet Take 5 mg by mouth every evening.    Marland Kitchen LORazepam (ATIVAN) 0.5 MG tablet Take 0.5 mg by mouth as needed for anxiety.    . nitrofurantoin, macrocrystal-monohydrate, (MACROBID) 100 MG capsule Take 1 capsule (100 mg total) by mouth 2 (two) times daily. 14 capsule 0  . NON FORMULARY once weekly    . nystatin-triamcinolone (MYCOLOG II) cream Apply 1 application topically 2 (two) times daily. Apply to affected area BID for up to 7 days. 60 g 0  . olopatadine (PATANOL) 0.1 % ophthalmic solution INSTILL 1 DROP INTO BOTH EYES TWICE DAILY     No current facility-administered medications for this visit.    Family History  Problem Relation Age of Onset  . Heart disease Maternal  Grandmother   . Diabetes Maternal Grandfather   . Heart disease Maternal Grandfather   . Heart disease Paternal Grandfather   . Atrial fibrillation Mother     ROS:  Pertinent items are noted in HPI.  Otherwise, a comprehensive ROS was negative.  Exam:   BP 92/56 mmHg  Pulse 60  Resp 14  Ht 5\' 2"  (1.575 m)  Wt 125 lb 9.6 oz (56.972 kg)  BMI 22.97 kg/m2  LMP 05/09/2016  Weight change: +2#   Height: 5\' 2"  (157.5 cm)  Ht Readings from Last 3 Encounters:  05/16/16 5\' 2"  (1.575 m)  08/05/15 5\' 2"  (1.575 m)  02/19/15 5\' 2"  (1.575 m)    General appearance: alert, cooperative and appears stated age Head: Normocephalic, without obvious abnormality, atraumatic Neck: no adenopathy, supple, symmetrical, trachea midline and thyroid normal to inspection and palpation Lungs: clear to auscultation bilaterally Breasts: 2cm cystic lesion 3 o'clock left breast, right breast six o'clock multiple small cystic feeling areas, no nipple discharge or LAD Heart: regular rate and rhythm Abdomen: soft, non-tender; bowel sounds normal; no masses,  no organomegaly Extremities: extremities normal, atraumatic, no cyanosis or edema Skin: Skin color, texture, turgor normal. No rashes or lesions Lymph nodes: Cervical, supraclavicular, and axillary nodes normal. No abnormal inguinal nodes palpated Neurologic: Grossly  normal   Pelvic: External genitalia:  no lesions              Urethra:  normal appearing urethra with no masses, tenderness or lesions              Bartholins and Skenes: normal                 Vagina: normal appearing vagina with normal color and discharge, no lesions              Cervix: no lesions              Pap taken: Yes.   Bimanual Exam:  Uterus:  normal size, contour, position, consistency, mobility, non-tender              Adnexa: normal adnexa and no mass, fullness, tenderness               Rectovaginal: Confirms               Anus:  normal sphincter tone, no lesions  Chaperone was  present for exam.  A:  Well Woman with normal exam Dense breast tissue with bilateral cystic lesions H/O ASCUS pap with + HR HPV 4/15 and 2016 Colposcopy 4/15 and 2016 showed HPV changes only  P: Mammogram yearly. Will plan bilateral diagnostic due to physical exam today pap smear with HR HPV testing. Pt considering LEEP if this is again abnormal Labs with PCP.  Vaccines UTD. return annually or prn

## 2016-05-16 NOTE — Addendum Note (Signed)
Addended by: Jasmine Awe on: 05/16/2016 03:52 PM   Modules accepted: Orders

## 2016-05-16 NOTE — Progress Notes (Signed)
Scheduled patient while in office for bilateral diagnostic mammogram with [facility] ultras[facility] on 05/19/2016 at 12:40 pm. She is agreeable to date and time. Will placed in mammogram hold.

## 2016-05-19 ENCOUNTER — Ambulatory Visit
Admission: RE | Admit: 2016-05-19 | Discharge: 2016-05-19 | Disposition: A | Discharge status: Home / Self Care | Payer: BLUE CROSS/BLUE SHIELD | Source: Ambulatory Visit | Attending: Obstetrics & Gynecology | Admitting: Obstetrics & Gynecology

## 2016-05-19 DIAGNOSIS — N6310 Unspecified lump in the right breast, unspecified quadrant: Secondary | ICD-10-CM

## 2016-05-19 DIAGNOSIS — N6320 Unspecified lump in the left breast, unspecified quadrant: Secondary | ICD-10-CM

## 2016-05-23 LAB — IPS PAP TEST WITH HPV

## 2016-05-26 ENCOUNTER — Telehealth: Payer: Self-pay

## 2016-05-26 DIAGNOSIS — Z8742 Personal history of other diseases of the female genital tract: Secondary | ICD-10-CM

## 2016-05-26 DIAGNOSIS — R87610 Atypical squamous cells of undetermined significance on cytologic smear of cervix (ASC-US): Secondary | ICD-10-CM

## 2016-05-26 NOTE — Telephone Encounter (Signed)
Left message to call Irene Mitcham at [phone]. 

## 2016-05-26 NOTE — Telephone Encounter (Signed)
-----   Message from Megan Salon, MD sent at 05/25/2016 12:37 PM EDT ----- Please inform that pap showed ASCUS with negative HR HPV testing.  H/O abnormal pap smears over last several years so needs repeat colposcopy.

## 2016-05-27 NOTE — Telephone Encounter (Signed)
Ok to do colposcopy with LEEP at same time so ok to schedule this as a LEEP.

## 2016-05-27 NOTE — Telephone Encounter (Signed)
Left message to call Kaylyn Garrow at [phone]. 

## 2016-05-27 NOTE — Telephone Encounter (Signed)
Patient returned call

## 2016-05-27 NOTE — Telephone Encounter (Signed)
Spoke with patient. Advised of message as seen below from [place]. Patient is agreeable and verbalizes understanding. States that she and Dr.Miller discussed possibly moving forward with a LEEP if this pap returned abnormal. Reports she does not want to have to have a colposcopy and then a LEEP.  Would rather proceed with LEEP first if it will be needed. Asking for Dr.Miller's recommendations. Advised I will send a message to [place] and return call with further recommendations. She is agreeable.

## 2016-05-30 NOTE — Telephone Encounter (Signed)
Spoke with patient. Advised of message as seen below from [place]. She is agreeable and would like to proceed with LEEP at this time. Patient's husband has had a vasectomy. LMP was 05/10/2016. Reports she is due to start her cycle at the end of this week. Appointment for LEEP scheduled for 06/14/2016 at 10 am with Dr.Miller.  Instructions given. Motrin 800 mg po x , one hour before appointment with food. Make sure to eat a meal before appointment and drink plenty of fluids. Patient verbalized understanding and will call to reschedule if will be on menses. Order placed for precert.  Cc: Lerry Liner  Routing to provider for final review. Patient agreeable to disposition. Will close encounter.

## 2016-06-14 ENCOUNTER — Encounter: Payer: Self-pay | Admitting: Obstetrics & Gynecology

## 2016-06-14 ENCOUNTER — Ambulatory Visit (INDEPENDENT_AMBULATORY_CARE_PROVIDER_SITE_OTHER): Payer: BLUE CROSS/BLUE SHIELD | Admitting: Obstetrics & Gynecology

## 2016-06-14 VITALS — BP 98/60 | HR 68 | Resp 16 | Ht 62.0 in | Wt 125.6 lb

## 2016-06-14 DIAGNOSIS — Z01812 Encounter for preprocedural laboratory examination: Secondary | ICD-10-CM

## 2016-06-14 DIAGNOSIS — B977 Papillomavirus as the cause of diseases classified elsewhere: Secondary | ICD-10-CM

## 2016-06-14 DIAGNOSIS — Z8742 Personal history of other diseases of the female genital tract: Secondary | ICD-10-CM

## 2016-06-14 DIAGNOSIS — R87610 Atypical squamous cells of undetermined significance on cytologic smear of cervix (ASC-US): Secondary | ICD-10-CM

## 2016-06-14 DIAGNOSIS — N870 Mild cervical dysplasia: Secondary | ICD-10-CM | POA: Insufficient documentation

## 2016-06-14 LAB — POCT URINE PREGNANCY: Preg Test, Ur: NEGATIVE

## 2016-06-14 NOTE — Patient Instructions (Signed)

## 2016-06-14 NOTE — Progress Notes (Signed)
45 y.o. 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  This has been present for three years.  This year, her HR HPV testing was negative.  However, colposcopy was recommended due to ASCCP guidelines.  Pt declined repeat colposcopy and desired progression to treatment.  Advised this was possible and she is here for colposcopy and LEEP today.    Patient's last menstrual period was 06/05/2016 (exact date).          Sexually active: Yes.    The current method of family planning is vasectomy.     Pre-procedure vitals: Blood pressure 98/60, pulse 68, resp. rate 16, height 5\' 2"  (1.575 m), weight 125 lb 9.6 oz (57 kg), last menstrual period 06/05/2016.   Procedure explained and patient's questions were invited and answered.   Consent form signed.  Procedure Set-up: Grounding pad located left thigh.  Cautery settings: 55 cut/55 coagulation.  Suction applied to coated speculum.  Procedure:  Speculum placed with good visualization of the cervix.  Colposcopy performed showing:  acetowhite lesion(s) noted at 9-10 o'clock.  Cervix anesthetized using 2% Xylocaine with 1:100,000units Epinephrine.  8 cc's used.  Entire transition zone excised with 12 x 31mm loop in 1 pass.  Additional specimen at 12 and 9 o'clock obtained due to location of AWE today.  Specimen(s) placed on cork and labeled for pathology.  Hemostasis obtained with ball cautery and Monsel's solution.  EBL:  [id number]  Complications:  none  Patient tolerated procedure well and left the office in satisfactory condition.  Plan:  After visit summary given.  Follow up and repeat pap smear recommendations will be made after pathology is back.  Pt will be notified.

## 2016-06-16 LAB — IPS OTHER TISSUE BIOPSY

## 2016-06-17 ENCOUNTER — Telehealth: Payer: Self-pay | Admitting: *Deleted

## 2016-06-17 NOTE — Telephone Encounter (Signed)
Called patient to review LEEP results. Patient verbalized understanding. Patient states she is having some " dark and clumpy" discharge. Patient states the only word she can think to describe is "almost fleshy." States it is not bright red. Denies fever or any bleeding. Patient states the discharge did not alarm her, but she just wanted to make sure it was normal. RN stated it could be from the monsels Dr. Sabra Heck used on her cervix. Advised patient to monitor and if she started having any heavy bright red bleeding to call our office. Patient verbalized understanding.   Routing to provider for review.

## 2016-06-17 NOTE — Telephone Encounter (Signed)
-----   Message from Megan Salon, MD sent at 06/17/2016  7:49 AM EDT ----- Please let pt know LEEP showed only CIN 1.  [place] for repeat pap and HR HPV 1 year.  08 recall.  Out of current recall, if present.

## 2016-06-24 ENCOUNTER — Encounter: Payer: Self-pay | Admitting: Obstetrics & Gynecology

## 2017-03-13 ENCOUNTER — Other Ambulatory Visit: Payer: Self-pay | Admitting: Obstetrics & Gynecology

## 2017-03-13 DIAGNOSIS — Z1231 Encounter for screening mammogram for malignant neoplasm of breast: Secondary | ICD-10-CM

## 2017-05-19 ENCOUNTER — Telehealth: Payer: Self-pay | Admitting: Obstetrics & Gynecology

## 2017-05-19 NOTE — Telephone Encounter (Signed)
Left patient a message to call back to reschedule a future appointment that was cancelled by the provider for an AEX. °

## 2017-05-22 ENCOUNTER — Encounter: Payer: Self-pay | Admitting: Obstetrics & Gynecology

## 2017-05-22 ENCOUNTER — Ambulatory Visit: Payer: BLUE CROSS/BLUE SHIELD | Admitting: Obstetrics & Gynecology

## 2017-05-23 ENCOUNTER — Telehealth: Payer: Self-pay

## 2017-05-23 NOTE — Telephone Encounter (Signed)
Non-Urgent Medical Question  Message [id number]  From [place], MD Sent 05/22/2017 2:34 PM  My appointment for my annual exam was canceled due to surgery conflict today. When I tried to reschedule it the first available is in late september. I believe Dr. Sabra Heck based on my history would prefer seeing me before September. Will you please check and let me know?   Thank you!  Carrie Higgins   Responsible Party   Pool - [facility] Pool No one has taken responsibility for this message.  No actions have been taken on this message.   Spoke with patient. Moved aex appointment to 05/30/2017 at 3:30 pm with Dr.Miller. Patient is agreeable to date and time.  Routing to provider for final review. Patient agreeable to disposition. Will close encounter.

## 2017-05-23 NOTE — Telephone Encounter (Signed)
Spoke with patient. Please see telephone encounter dated with today's date.

## 2017-05-24 ENCOUNTER — Ambulatory Visit: Payer: BLUE CROSS/BLUE SHIELD

## 2017-05-30 ENCOUNTER — Other Ambulatory Visit (HOSPITAL_COMMUNITY)
Admission: RE | Admit: 2017-05-30 | Discharge: 2017-05-30 | Disposition: A | Discharge status: Home / Self Care | Payer: BLUE CROSS/BLUE SHIELD | Source: Ambulatory Visit | Attending: Obstetrics & Gynecology | Admitting: Obstetrics & Gynecology

## 2017-05-30 ENCOUNTER — Encounter: Payer: Self-pay | Admitting: Obstetrics & Gynecology

## 2017-05-30 ENCOUNTER — Ambulatory Visit (INDEPENDENT_AMBULATORY_CARE_PROVIDER_SITE_OTHER): Payer: BLUE CROSS/BLUE SHIELD | Admitting: Obstetrics & Gynecology

## 2017-05-30 VITALS — BP 102/62 | HR 72 | Resp 16 | Ht 61.25 in | Wt 122.0 lb

## 2017-05-30 DIAGNOSIS — Z01419 Encounter for gynecological examination (general) (routine) without abnormal findings: Secondary | ICD-10-CM | POA: Diagnosis not present

## 2017-05-30 DIAGNOSIS — R3129 Other microscopic hematuria: Secondary | ICD-10-CM | POA: Diagnosis not present

## 2017-05-30 DIAGNOSIS — Z0000 Encounter for general adult medical examination without abnormal findings: Secondary | ICD-10-CM | POA: Diagnosis not present

## 2017-05-30 DIAGNOSIS — R87610 Atypical squamous cells of undetermined significance on cytologic smear of cervix (ASC-US): Secondary | ICD-10-CM | POA: Diagnosis not present

## 2017-05-30 DIAGNOSIS — Z124 Encounter for screening for malignant neoplasm of cervix: Secondary | ICD-10-CM | POA: Diagnosis not present

## 2017-05-30 LAB — POCT URINALYSIS DIPSTICK
Bilirubin, UA: NEGATIVE
Glucose, UA: NEGATIVE
Ketones, UA: NEGATIVE
Nitrite, UA: NEGATIVE
Urobilinogen, UA: 0.2 E.U./dL
pH, UA: 7 (ref 5.0–8.0)

## 2017-05-30 NOTE — Progress Notes (Signed)
46 y.o. G1P1 MarriedCaucasianF here for annual exam.  Doing well.  Just got back from trip to [facility].  Went with 8 other family members.  Was a great trip.  Reviewed with pt new guidelines regarding colon caner screening.    Cycles are about every 25 days.  Flow lasts 4-5 days.  Two days are heavy.  No change.  Not desirous of treatment.   Patient's last menstrual period was 05/07/2017.          Sexually active: Yes.    The current method of family planning is vasectomy.    Exercising: Yes.    cardio, weights Smoker:  no  Health Maintenance: Pap:  05/16/16 ASCUS. HR HPV:neg   02/19/15 ASCUS. HR HPV:+detected  History of abnormal Pap:  Yes, LEEP CIN1 MMG:  05/19/16 [place] Right and Left BIRADS2:Benign. Has appt 06/02/17 Colonoscopy:  Never.  Father with long hx of pre-cancerous polyps. BMD: ~2007 Osteopenia  TDaP:  11/29/07  Pneumonia vaccine(s):  No Zostavax:   No Hep C testing: N/A Screening Labs: PCP, Urine today: WBC=Small, RBC=Moderate    reports that she has never smoked. She has never used smokeless tobacco. She reports that she drinks about 0.5 - 1.0 oz of alcohol per week . She reports that she does not use drugs.  Past Medical History:  Diagnosis Date  . Abnormal pap 2006   2006 ASCUS - HPV, 02-11-14 ASCUS +HPV  . Basal cell carcinoma    Dr. Wilhemina Bonito  . Environmental allergies   . Osteopenia    BMD age 23    History reviewed. No pertinent surgical history.  Current Outpatient Prescriptions  Medication Sig Dispense Refill  . Azelastine-Fluticasone (DYMISTA) 137-50 MCG/ACT SUSP Place 1 spray into the nose daily.    Marland Kitchen FLUoxetine (PROZAC) 10 MG capsule Take 1 capsule by mouth daily.    Marland Kitchen levocetirizine (XYZAL) 5 MG tablet Take 5 mg by mouth every evening.    Marland Kitchen LORazepam (ATIVAN) 0.5 MG tablet Take 0.5 mg by mouth as needed for anxiety.    Marland Kitchen olopatadine (PATANOL) 0.1 % ophthalmic solution Place 1 drop into both eyes daily as needed. Reported on 05/16/2016     No  current facility-administered medications for this visit.     Family History  Problem Relation Age of Onset  . Heart disease Maternal Grandmother   . Diabetes Maternal Grandfather   . Heart disease Maternal Grandfather   . Heart disease Paternal Grandfather   . Atrial fibrillation Mother     ROS:  Pertinent items are noted in HPI.  Otherwise, a comprehensive ROS was negative.  Exam:   BP 102/62 (BP Location: Right Arm, Patient Position: Sitting, Cuff Size: Normal)   Pulse 72   Resp 16   Ht 5' 1.25" (1.556 m)   Wt 122 lb (55.3 kg)   LMP 05/07/2017   BMI 22.86 kg/m    Height: 5' 1.25" (155.6 cm)  Ht Readings from Last 3 Encounters:  05/30/17 5' 1.25" (1.556 m)  06/14/16 5\' 2"  (1.575 m)  05/16/16 5\' 2"  (1.575 m)    General appearance: alert, cooperative and appears stated age Head: Normocephalic, without obvious abnormality, atraumatic Neck: no adenopathy, supple, symmetrical, trachea midline and thyroid normal to inspection and palpation Lungs: clear to auscultation bilaterally Breasts: normal appearance, no masses or tenderness Heart: regular rate and rhythm Abdomen: soft, non-tender; bowel sounds normal; no masses,  no organomegaly Extremities: extremities normal, atraumatic, no cyanosis or edema Skin: Skin color, texture, turgor  normal. No rashes or lesions Lymph nodes: Cervical, supraclavicular, and axillary nodes normal. No abnormal inguinal nodes palpated Neurologic: Grossly normal   Pelvic: External genitalia:  no lesions              Urethra:  normal appearing urethra with no masses, tenderness or lesions              Bartholins and Skenes: normal                 Vagina: normal appearing vagina with normal color and discharge, no lesions              Cervix: no lesions              Pap taken: Yes.   Bimanual Exam:  Uterus:  normal size, contour, position, consistency, mobility, non-tender              Adnexa: normal adnexa and no mass, fullness, tenderness                Rectovaginal: Confirms               Anus:  normal sphincter tone, no lesions  Chaperone was present for exam.  A:  Well Woman with normal exam H/O breast density with bilateral breast cysts H/O ASCUS pap with +HR HPV, LEEP 8/17 with CIN 1 Family hx of colon polyps Long hx of microscopic hematuria (reviewed PCP labs and present at every visit)  P:   Mammogram guidelines reviewed.  Doing 3D MMG pap smear and HR HPV obtained today Referral will be made to GI.  Pt wants to let me know name for referral but needs to check with her father first Lab work and vaccines UTD return annually or prn

## 2017-06-02 ENCOUNTER — Ambulatory Visit
Admission: RE | Admit: 2017-06-02 | Discharge: 2017-06-02 | Disposition: A | Discharge status: Home / Self Care | Payer: BLUE CROSS/BLUE SHIELD | Source: Ambulatory Visit | Attending: Obstetrics & Gynecology | Admitting: Obstetrics & Gynecology

## 2017-06-02 DIAGNOSIS — Z1231 Encounter for screening mammogram for malignant neoplasm of breast: Secondary | ICD-10-CM

## 2017-06-05 LAB — CYTOLOGY - PAP
Diagnosis: NEGATIVE
HPV: NOT DETECTED

## 2017-06-29 ENCOUNTER — Encounter: Payer: Self-pay | Admitting: Internal Medicine

## 2017-07-20 ENCOUNTER — Ambulatory Visit: Payer: BLUE CROSS/BLUE SHIELD | Admitting: Obstetrics & Gynecology

## 2017-08-28 ENCOUNTER — Ambulatory Visit: Payer: BLUE CROSS/BLUE SHIELD | Admitting: Obstetrics & Gynecology

## 2017-08-29 ENCOUNTER — Encounter: Payer: Self-pay | Admitting: Internal Medicine

## 2017-08-29 ENCOUNTER — Ambulatory Visit (INDEPENDENT_AMBULATORY_CARE_PROVIDER_SITE_OTHER): Payer: BLUE CROSS/BLUE SHIELD | Admitting: Internal Medicine

## 2017-08-29 VITALS — BP 100/72 | HR 68 | Ht 62.0 in | Wt 126.0 lb

## 2017-08-29 DIAGNOSIS — Z8371 Family history of colonic polyps: Secondary | ICD-10-CM | POA: Diagnosis not present

## 2017-08-29 MED ORDER — SUPREP BOWEL PREP KIT 17.5-3.13-1.6 GM/177ML PO SOLN: 1.0000 | ORAL | 0 refills | Status: DC

## 2017-08-29 NOTE — Patient Instructions (Signed)
You have been scheduled for a colonoscopy. Please follow written instructions given to you at your visit today.  Please pick up your prep supplies at the pharmacy within the next 1-3 days. If you use inhalers (even only as needed), please bring them with you on the day of your procedure. Your physician has requested that you go to www.startemmi.com and enter the access code given to you at your visit today. This web site gives a general overview about your procedure. However, you should still follow specific instructions given to you by our office regarding your preparation for the procedure.   If you are age 76 or older, your body mass index should be between 23-30. Your Body mass index is 23.05 kg/m. If this is out of the aforementioned range listed, please consider follow up with your Primary Care Provider.  If you are age 39 or younger, your body mass index should be between 19-25. Your Body mass index is 23.05 kg/m. If this is out of the aformentioned range listed, please consider follow up with your Primary Care Provider.

## 2017-08-29 NOTE — Progress Notes (Signed)
Patient ID: Carrie Higgins, female   DOB: 1971/10/21, 46 y.o.   MRN: [id number] HPI: Carrie Higgins is a 46 year old female with little past medical history other than environmental allergies and a small basal cell carcinoma with pending Mohs surgery who is seen in consultation at the request of Joni Reining, PA-C to evaluate family history of colon polyps and consider screening colonoscopy. She is here alone today. Both her mother and father are patients of mine. Her father has a history of multiple adenomatous colon polyps and sessile serrated polyps.  The patient reports that she has no specific GI symptoms. She has noticed some very mild constipation which seems to come and go. She normally has daily bowel movements but at times can have the feeling of incomplete evacuation. This is worse with traveling or breaks in her routine. She denies diarrhea. Denies blood in her stool or melena. No abdominal pain. History of very rare heartburn but not currently on medication. No dysphagia or odynophagia. No nausea or vomiting. No weight loss.  She takes very little medicine other than an allergy nasal spray and levo cetirizine.  Past Medical History:  Diagnosis Date  . Abnormal pap 2006   2006 ASCUS - HPV, 02-11-14 ASCUS +HPV  . Basal cell carcinoma    Dr. Wilhemina Bonito  . Environmental allergies   . Osteopenia    BMD age 73    History reviewed. No pertinent surgical history.  Outpatient Medications Prior to Visit  Medication Sig Dispense Refill  . Azelastine-Fluticasone (DYMISTA) 137-50 MCG/ACT SUSP Place 1 spray into the nose daily.    Marland Kitchen levocetirizine (XYZAL) 5 MG tablet Take 5 mg by mouth every evening.    Marland Kitchen LORazepam (ATIVAN) 0.5 MG tablet Take 0.5 mg by mouth as needed for anxiety.    Marland Kitchen olopatadine (PATANOL) 0.1 % ophthalmic solution Place 1 drop into both eyes daily as needed. Reported on 05/16/2016    . FLUoxetine (PROZAC) 10 MG capsule Take 1 capsule by mouth daily.     No  facility-administered medications prior to visit.     Allergies  Allergen Reactions  . Levaquin [Levofloxacin] Other (See Comments)    "numbness in arm and legs"  . Augmentin [Amoxicillin-Pot Clavulanate] Rash  . Neosporin [Neomycin-Bacitracin Zn-Polymyx] Rash    Family History  Problem Relation Age of Onset  . Heart disease Maternal Grandmother   . Diabetes Maternal Grandfather   . Heart disease Maternal Grandfather   . Heart disease Paternal Grandfather   . Atrial fibrillation Mother   . Colon polyps Father        Precancerous    Social History  Substance Use Topics  . Smoking status: Never Smoker  . Smokeless tobacco: Never Used  . Alcohol use 0.5 - 1.0 oz/week    1 - 2 Standard drinks or equivalent per week    ROS: As per history of present illness, otherwise negative  BP 100/72   Pulse 68   Ht 5\' 2"  (1.575 m)   Wt 126 lb (57.2 kg)   BMI 23.05 kg/m  Constitutional: Well-developed and well-nourished. No distress. HEENT: Normocephalic and atraumatic. Conjunctivae are normal.  No scleral icterus. Neck: Neck supple. Trachea midline. Cardiovascular: Normal rate, regular rhythm and intact distal pulses. No M/R/G Pulmonary/chest: Effort normal and breath sounds normal. No wheezing, rales or rhonchi. Abdominal: Soft, nontender, nondistended. Bowel sounds active throughout. There are no masses palpable. No hepatosplenomegaly. Extremities: no clubbing, cyanosis, or edema Neurological: Alert and oriented to person place  and time. Skin: Skin is warm and dry.  Psychiatric: Normal mood and affect. Behavior is normal.  RELEVANT LABS AND IMAGING: CBC    Component Value Date/Time   WBC 5.5 10/23/2013 0949   RBC 4.75 10/23/2013 0949   HGB 14.1 10/23/2013 0949   HCT 40.8 10/23/2013 0949   PLT 208 10/23/2013 0949   MCV 85.9 10/23/2013 0949   MCH 29.7 10/23/2013 0949   MCHC 34.6 10/23/2013 0949   RDW 13.5 10/23/2013 0949   LYMPHSABS 1.6 10/23/2013 0949   MONOABS 0.4  10/23/2013 0949   EOSABS 0.2 10/23/2013 0949   BASOSABS 0.0 10/23/2013 0949    CMP     Component Value Date/Time   NA 138 10/23/2013 0949   K 3.6 10/23/2013 0949   CL 104 10/23/2013 0949   CO2 27 10/23/2013 0949   GLUCOSE 87 10/23/2013 0949   BUN 11 10/23/2013 0949   CREATININE 0.72 10/23/2013 0949   CALCIUM 8.8 10/23/2013 0949   PROT 6.4 10/23/2013 0949   ALBUMIN 4.0 10/23/2013 0949   AST 16 10/23/2013 0949   ALT 11 10/23/2013 0949   ALKPHOS 43 10/23/2013 0949   BILITOT 0.5 10/23/2013 0949    ASSESSMENT/PLAN: 46 year old female with little past medical history other than environmental allergies and a small basal cell carcinoma with pending Mohs surgery who is seen in consultation at the request of Joni Reining, PA-C to evaluate family history of colon polyps and consider screening colonoscopy.  1. Family history of colon polyps -- her father has a history of multiple adenomatous colon polyps and sessile serrated polyps. For this reason she is appropriate for elevated risk screening colonoscopy due to family history of colon polyps. I have recommended colonoscopy. We discussed the risks, benefits and alternatives and she is agreeable and wishes to proceed.   W[place]:[facility], [address], [address] [place], [place] [id number]

## 2017-11-09 ENCOUNTER — Ambulatory Visit (AMBULATORY_SURGERY_CENTER): Payer: BLUE CROSS/BLUE SHIELD | Admitting: Internal Medicine

## 2017-11-09 ENCOUNTER — Encounter: Payer: Self-pay | Admitting: Internal Medicine

## 2017-11-09 VITALS — BP 92/62 | HR 65 | Temp 98.0°F | Resp 19 | Ht 62.0 in | Wt 126.0 lb

## 2017-11-09 DIAGNOSIS — D120 Benign neoplasm of cecum: Secondary | ICD-10-CM

## 2017-11-09 DIAGNOSIS — Z8371 Family history of colonic polyps: Secondary | ICD-10-CM | POA: Diagnosis not present

## 2017-11-09 DIAGNOSIS — D122 Benign neoplasm of ascending colon: Secondary | ICD-10-CM

## 2017-11-09 DIAGNOSIS — Z1212 Encounter for screening for malignant neoplasm of rectum: Secondary | ICD-10-CM

## 2017-11-09 DIAGNOSIS — Z1211 Encounter for screening for malignant neoplasm of colon: Secondary | ICD-10-CM | POA: Diagnosis not present

## 2017-11-09 MED ORDER — SODIUM CHLORIDE 0.9 % IV SOLN: 500.0000 mL | Freq: Once | INTRAVENOUS | Status: DC

## 2017-11-09 NOTE — Progress Notes (Signed)
Called to room to assist during endoscopic procedure.  Patient ID and intended procedure confirmed with present staff. Received instructions for my participation in the procedure from the performing physician.  

## 2017-11-09 NOTE — Progress Notes (Signed)
Pt's states no medical or surgical changes since previsit or office visit. 

## 2017-11-09 NOTE — Patient Instructions (Signed)
Discharge instructions given. Handout on polyps. Resume previous medications. YOU HAD AN ENDOSCOPIC PROCEDURE TODAY AT THE [facility]:   Refer to the procedure report that was given to you for any specific questions about what was found during the examination.  If the procedure report does not answer your questions, please call your gastroenterologist to clarify.  If you requested that your care partner not be given the details of your procedure findings, then the procedure report has been included in a sealed envelope for you to review at your convenience later.  YOU SHOULD EXPECT: Some feelings of bloating in the abdomen. Passage of more gas than usual.  Walking can help get rid of the air that was put into your GI tract during the procedure and reduce the bloating. If you had a lower endoscopy (such as a colonoscopy or flexible sigmoidoscopy) you may notice spotting of blood in your stool or on the toilet paper. If you underwent a bowel prep for your procedure, you may not have a normal bowel movement for a few days.  Please Note:  You might notice some irritation and congestion in your nose or some drainage.  This is from the oxygen used during your procedure.  There is no need for concern and it should clear up in a day or so.  SYMPTOMS TO REPORT IMMEDIATELY:   Following lower endoscopy (colonoscopy or flexible sigmoidoscopy):  Excessive amounts of blood in the stool  Significant tenderness or worsening of abdominal pains  Swelling of the abdomen that is new, acute  Fever of 100F or higher   For urgent or emergent issues, a gastroenterologist can be reached at any hour by calling [phone].   DIET:  We do recommend a small meal at first, but then you may proceed to your regular diet.  Drink plenty of fluids but you should avoid alcoholic beverages for 24 hours.  ACTIVITY:  You should plan to take it easy for the rest of today and you should NOT DRIVE or use heavy  machinery until tomorrow (because of the sedation medicines used during the test).    FOLLOW UP: Our staff will call the number listed on your records the next business day following your procedure to check on you and address any questions or concerns that you may have regarding the information given to you following your procedure. If we do not reach you, we will leave a message.  However, if you are feeling well and you are not experiencing any problems, there is no need to return our call.  We will assume that you have returned to your regular daily activities without incident.  If any biopsies were taken you will be contacted by phone or by letter within the next 1-3 weeks.  Please call us at [phone] if you have not heard about the biopsies in 3 weeks.    SIGNATURES/CONFIDENTIALITY: You and/or your care partner have signed paperwork which will be entered into your electronic medical record.  These signatures attest to the fact that that the information above on your After Visit Summary has been reviewed and is understood.  Full responsibility of the confidentiality of this discharge information lies with you and/or your care-partner. 

## 2017-11-09 NOTE — Op Note (Signed)
[facility] Patient Name: Carrie Higgins Procedure Date: 11/09/2017 11:50 AM MRN: [id number] Endoscopist: Jerene Bears , MD Age: 47 Referring MD:  Date of Birth: Mar 09, 1971 Gender: Female Account #: [id number] Procedure:                Colonoscopy Indications:              Colon cancer screening in patient at increased                            risk: Family history of 1st-degree relative with                            colon polyps Medicines:                Monitored Anesthesia Care Procedure:                Pre-Anesthesia Assessment:                           - Prior to the procedure, a History and Physical                            was performed, and patient medications and                            allergies were reviewed. The patient's tolerance of                            previous anesthesia was also reviewed. The risks                            and benefits of the procedure and the sedation                            options and risks were discussed with the patient.                            All questions were answered, and informed consent                            was obtained. Prior Anticoagulants: The patient has                            taken no previous anticoagulant or antiplatelet                            agents. ASA Grade Assessment: II - A patient with                            mild systemic disease. After reviewing the risks                            and benefits, the patient was deemed in  satisfactory condition to undergo the procedure.                           After obtaining informed consent, the colonoscope                            was passed under direct vision. Throughout the                            procedure, the patient's blood pressure, pulse, and                            oxygen saturations were monitored continuously. The                            Model CF-HQ190L [id number]) scope was introduced                             through the anus and advanced to the the cecum,                            identified by appendiceal orifice and ileocecal                            valve. The colonoscopy was performed without                            difficulty. The patient tolerated the procedure                            well. The quality of the bowel preparation was                            good. The ileocecal valve, appendiceal orifice, and                            rectum were photographed. Scope In: 11:47:56 AM Scope Out: 12:03:50 PM Scope Withdrawal Time: 0 hours 13 minutes 46 seconds  Total Procedure Duration: 0 hours 15 minutes 54 seconds  Findings:                 The digital rectal exam was normal.                           Two sessile polyps were found in the cecum. The                            polyps were 4 to 6 mm in size. These polyps were                            removed with a cold snare. Resection and retrieval                            were complete.  Two flat polyps were found in the ascending colon.                            The polyps were 6 to 8 mm in size. These polyps                            were removed with a cold snare. Resection and                            retrieval were complete.                           The exam was otherwise without abnormality on                            direct and retroflexion views. Complications:            No immediate complications. Estimated Blood Loss:     Estimated blood loss was minimal. Impression:               - Two 4 to 6 mm polyps in the cecum, removed with a                            cold snare. Resected and retrieved.                           - Two 6 to 8 mm polyps in the ascending colon,                            removed with a cold snare. Resected and retrieved.                           - The examination was otherwise normal on direct                            and  retroflexion views. Recommendation:           - Patient has a contact number available for                            emergencies. The signs and symptoms of potential                            delayed complications were discussed with the                            patient. Return to normal activities tomorrow.                            Written discharge instructions were provided to the                            patient.                           -  Resume previous diet.                           - Continue present medications.                           - Await pathology results.                           - Repeat colonoscopy is recommended for                            surveillance. The colonoscopy date will be                            determined after pathology results from today's                            exam become available for review. Jerene Bears, MD 11/09/2017 12:07:11 PM This report has been signed electronically.

## 2017-11-09 NOTE — Progress Notes (Signed)
Report to PACU, RN, vss, BBS= Clear.  

## 2017-11-10 ENCOUNTER — Telehealth: Payer: Self-pay | Admitting: *Deleted

## 2017-11-10 NOTE — Telephone Encounter (Signed)
  Follow up Call-  Call back number 11/09/2017  Post procedure Call Back phone  # [phone]  Permission to leave phone message Yes  Some recent data might be hidden     Patient questions:  Do you have a fever, pain , or abdominal swelling? No. Pain Score  0 *  Have you tolerated food without any problems? Yes.    Have you been able to return to your normal activities? Yes.    Do you have any questions about your discharge instructions: Diet   No. Medications  No. Follow up visit  No.  Do you have questions or concerns about your Care? No.  Actions: * If pain score is 4 or above: No action needed, pain <4.

## 2017-11-15 ENCOUNTER — Encounter: Payer: Self-pay | Admitting: Internal Medicine

## 2018-06-19 ENCOUNTER — Other Ambulatory Visit: Payer: Self-pay | Admitting: Obstetrics & Gynecology

## 2018-06-19 DIAGNOSIS — Z1231 Encounter for screening mammogram for malignant neoplasm of breast: Secondary | ICD-10-CM

## 2018-07-16 ENCOUNTER — Ambulatory Visit
Admission: RE | Admit: 2018-07-16 | Discharge: 2018-07-16 | Disposition: A | Discharge status: Home / Self Care | Payer: BLUE CROSS/BLUE SHIELD | Source: Ambulatory Visit | Attending: Obstetrics & Gynecology | Admitting: Obstetrics & Gynecology

## 2018-07-16 DIAGNOSIS — Z1231 Encounter for screening mammogram for malignant neoplasm of breast: Secondary | ICD-10-CM

## 2018-07-31 ENCOUNTER — Ambulatory Visit: Payer: BLUE CROSS/BLUE SHIELD | Admitting: Obstetrics & Gynecology

## 2018-08-07 ENCOUNTER — Other Ambulatory Visit (HOSPITAL_COMMUNITY)
Admission: RE | Admit: 2018-08-07 | Discharge: 2018-08-07 | Disposition: A | Discharge status: Home / Self Care | Payer: BLUE CROSS/BLUE SHIELD | Source: Ambulatory Visit | Attending: Obstetrics & Gynecology | Admitting: Obstetrics & Gynecology

## 2018-08-07 ENCOUNTER — Encounter: Payer: Self-pay | Admitting: Obstetrics & Gynecology

## 2018-08-07 ENCOUNTER — Ambulatory Visit (INDEPENDENT_AMBULATORY_CARE_PROVIDER_SITE_OTHER): Payer: BLUE CROSS/BLUE SHIELD | Admitting: Obstetrics & Gynecology

## 2018-08-07 ENCOUNTER — Other Ambulatory Visit: Payer: Self-pay

## 2018-08-07 ENCOUNTER — Encounter

## 2018-08-07 VITALS — BP 102/66 | HR 64 | Resp 16 | Ht 61.75 in | Wt 126.6 lb

## 2018-08-07 DIAGNOSIS — N926 Irregular menstruation, unspecified: Secondary | ICD-10-CM | POA: Diagnosis not present

## 2018-08-07 DIAGNOSIS — Z01419 Encounter for gynecological examination (general) (routine) without abnormal findings: Secondary | ICD-10-CM | POA: Diagnosis not present

## 2018-08-07 DIAGNOSIS — B977 Papillomavirus as the cause of diseases classified elsewhere: Secondary | ICD-10-CM | POA: Insufficient documentation

## 2018-08-07 DIAGNOSIS — Z0000 Encounter for general adult medical examination without abnormal findings: Secondary | ICD-10-CM

## 2018-08-07 DIAGNOSIS — Z124 Encounter for screening for malignant neoplasm of cervix: Secondary | ICD-10-CM

## 2018-08-07 LAB — POCT URINALYSIS DIPSTICK
Bilirubin, UA: NEGATIVE
Glucose, UA: NEGATIVE
Ketones, UA: NEGATIVE
Leukocytes, UA: NEGATIVE
Nitrite, UA: NEGATIVE
Protein, UA: NEGATIVE
Urobilinogen, UA: 0.2 E.U./dL
pH, UA: 6 (ref 5.0–8.0)

## 2018-08-07 LAB — POCT URINE PREGNANCY: Preg Test, Ur: NEGATIVE

## 2018-08-07 NOTE — Progress Notes (Signed)
47 y.o. G1P1 Married White or Caucasian female here for annual exam.  Doing well.  Still having cycles but they have changed this past year.  Cycles can be 21-13 up to 28-29 days.  Having more spotting.   Having some sleep issues as she doesn't sleep as well as she used to.  Having a little bit of light headedness as well.  Night dizzy.  No vertigo.    Having more issues with worry.  Took prozac 10mg  daily but never really got into the habit of taking it.    Patient's last menstrual period was 07/27/2018 (approximate).          Sexually active: Yes.    The current method of family planning is vasectomy.    Exercising: Yes.    cardio and weights  Smoker:  no  Health Maintenance: Pap:  05/30/17 Neg. HR HPV:neg   05/16/16 ASCUS. HR HPV:neg  History of abnormal Pap:  Yes, LEEP CIN I MMG:  07/16/18 BIRADS1:neg  Colonoscopy:  11/09/17 Polyps. F/u 3 years  BMD:   2007 Osteopenia  TDaP:  2019 Screening Labs: PCP   reports that she has never smoked. She has never used smokeless tobacco. She reports that she drinks about 3.0 standard drinks of alcohol per week. She reports that she does not use drugs.  Past Medical History:  Diagnosis Date  . Abnormal pap 2006   2006 ASCUS - HPV, 02-11-14 ASCUS +HPV  . Basal cell carcinoma    Dr. Wilhemina Bonito  . Environmental allergies   . Osteopenia    BMD age 54    History reviewed. No pertinent surgical history.  Current Outpatient Medications  Medication Sig Dispense Refill  . Azelastine-Fluticasone (DYMISTA) 137-50 MCG/ACT SUSP Place 1 spray into the nose daily.    Marland Kitchen levocetirizine (XYZAL) 5 MG tablet Take 5 mg by mouth every evening.    Marland Kitchen LORazepam (ATIVAN) 0.5 MG tablet Take 0.5 mg by mouth as needed for anxiety.    Marland Kitchen olopatadine (PATANOL) 0.1 % ophthalmic solution Place 1 drop into both eyes daily as needed. Reported on 05/16/2016     Current Facility-Administered Medications  Medication Dose Route Frequency Provider Last Rate Last Dose  . 0.9 %   sodium chloride infusion  500 mL Intravenous Once Pyrtle, Lajuan Lines, MD        Family History  Problem Relation Age of Onset  . Heart disease Maternal Grandmother   . Diabetes Maternal Grandfather   . Heart disease Maternal Grandfather   . Heart disease Paternal Grandfather   . Atrial fibrillation Mother   . Colon polyps Father        Precancerous    Review of Systems  Gastrointestinal: Positive for constipation.  Genitourinary: Positive for frequency and menstrual problem.  All other systems reviewed and are negative.   Exam:   BP 102/66 (BP Location: Right Arm, Patient Position: Sitting, Cuff Size: Normal)   Pulse 64   Resp 16   Ht 5' 1.75" (1.568 m)   Wt 126 lb 9.6 oz (57.4 kg)   LMP 07/27/2018 (Approximate)   BMI 23.34 kg/m   Height:   Height: 5' 1.75" (156.8 cm)  Ht Readings from Last 3 Encounters:  08/07/18 5' 1.75" (1.568 m)  11/09/17 5\' 2"  (1.575 m)  08/29/17 5\' 2"  (1.575 m)    General appearance: alert, cooperative and appears stated age Head: Normocephalic, without obvious abnormality, atraumatic Neck: no adenopathy, supple, symmetrical, trachea midline and thyroid normal to inspection and  palpation Lungs: clear to auscultation bilaterally Breasts: normal appearance, no masses or tenderness except for 1.5cm breast cyst just left and inferior of left nipple, beneath areaola Heart: regular rate and rhythm Abdomen: soft, non-tender; bowel sounds normal; no masses,  no organomegaly Extremities: extremities normal, atraumatic, no cyanosis or edema Skin: Skin color, texture, turgor normal. No rashes or lesions Lymph nodes: Cervical, supraclavicular, and axillary nodes normal. No abnormal inguinal nodes palpated Neurologic: Grossly normal   Pelvic: External genitalia:  no lesions              Urethra:  normal appearing urethra with no masses, tenderness or lesions              Bartholins and Skenes: normal                 Vagina: normal appearing vagina with normal  color and discharge, no lesions              Cervix: no lesions              Pap taken: Yes.   Bimanual Exam:  Uterus:  normal size, contour, position, consistency, mobility, non-tender              Adnexa: normal adnexa and no mass, fullness, tenderness               Rectovaginal: Confirms               Anus:  normal sphincter tone, no lesions  Chaperone was present for exam.  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  Doing 3D pap smear with HR HPV obtained today Trial of [place] John's wort discussed Colonoscopy UTD BMD screening discussed CMP, CBC, TSH, Vit D will be obtained today return annually or prn

## 2018-08-08 LAB — COMPREHENSIVE METABOLIC PANEL
ALT: 14 IU/L (ref 0–32)
AST: 14 IU/L (ref 0–40)
Albumin/Globulin Ratio: 1.8 (ref 1.2–2.2)
Albumin: 4.1 g/dL (ref 3.5–5.5)
Alkaline Phosphatase: 46 IU/L (ref 39–117)
BUN/Creatinine Ratio: 18 (ref 9–23)
BUN: 13 mg/dL (ref 6–24)
Bilirubin Total: 0.3 mg/dL (ref 0.0–1.2)
CO2: 23 mmol/L (ref 20–29)
Calcium: 9.2 mg/dL (ref 8.7–10.2)
Chloride: 100 mmol/L (ref 96–106)
Creatinine, Ser: 0.72 mg/dL (ref 0.57–1.00)
GFR calc Af Amer: 115 mL/min/{1.73_m2} (ref >59)
GFR calc non Af Amer: 100 mL/min/{1.73_m2} (ref >59)
Globulin, Total: 2.3 g/dL (ref 1.5–4.5)
Glucose: 75 mg/dL (ref 65–99)
Potassium: 3.9 mmol/L (ref 3.5–5.2)
Sodium: 139 mmol/L (ref 134–144)
Total Protein: 6.4 g/dL (ref 6.0–8.5)

## 2018-08-08 LAB — CBC
Hematocrit: 41.1 % (ref 34.0–46.6)
Hemoglobin: 13.4 g/dL (ref 11.1–15.9)
MCH: 29.5 pg (ref 26.6–33.0)
MCHC: 32.6 g/dL (ref 31.5–35.7)
MCV: 91 fL (ref 79–97)
Platelets: 220 10*3/uL (ref 150–450)
RBC: 4.54 x10E6/uL (ref 3.77–5.28)
RDW: 13.4 % (ref 12.3–15.4)
WBC: 6.4 10*3/uL (ref 3.4–10.8)

## 2018-08-08 LAB — CYTOLOGY - PAP
Diagnosis: NEGATIVE
HPV: NOT DETECTED

## 2018-08-08 LAB — VITAMIN D 25 HYDROXY (VIT D DEFICIENCY, FRACTURES): Vit D, 25-Hydroxy: 30.2 ng/mL (ref 30.0–100.0)

## 2018-08-08 LAB — TSH: TSH: 2.83 u[IU]/mL (ref 0.450–4.500)

## 2018-08-28 ENCOUNTER — Ambulatory Visit: Payer: BLUE CROSS/BLUE SHIELD | Admitting: Obstetrics & Gynecology

## 2019-03-29 ENCOUNTER — Telehealth: Payer: Self-pay | Admitting: Obstetrics & Gynecology

## 2019-03-29 NOTE — Telephone Encounter (Signed)
Patient has a tender knot on her right breast.

## 2019-03-29 NOTE — Telephone Encounter (Signed)
Spoke with patient. Lump on right breast 1 inch above nipple, noticed today after getting out of shower. Describes as round, tender, hard and red. Denies nipple d/c, fever/chills. Last MMG 07/16/18, normal. Recommended OV for further evaluation. OV scheduled for 6/1 at 4:30pm with Dr. Sabra Heck. Reviewed [phone] precautions, pre-screen negative. Recommended warm compress and OTC motrin PRN for pain, if needed. Patient verbalizes understanding.   Routing to provider for final review. Patient is agreeable to disposition. Will close encounter.

## 2019-04-01 ENCOUNTER — Encounter: Payer: Self-pay | Admitting: Obstetrics & Gynecology

## 2019-04-01 ENCOUNTER — Ambulatory Visit (INDEPENDENT_AMBULATORY_CARE_PROVIDER_SITE_OTHER): Payer: BLUE CROSS/BLUE SHIELD | Admitting: Obstetrics & Gynecology

## 2019-04-01 ENCOUNTER — Other Ambulatory Visit: Payer: Self-pay

## 2019-04-01 VITALS — BP 118/72 | HR 68 | Temp 97.9°F | Resp 16 | Wt 127.0 lb

## 2019-04-01 DIAGNOSIS — N6001 Solitary cyst of right breast: Secondary | ICD-10-CM

## 2019-04-01 NOTE — Progress Notes (Signed)
GYNECOLOGY  VISIT  CC:   Breast lump  HPI: 48 y.o. G1P1 Married White or Caucasian female here for right breast lump.  Reports she exercised on Friday morning.  She took a shower and, after drying herself, she was rubbing lotion on the breast.  There was a small red area on the breast that was tender to palpation.  Saturday, she felt it was more tender.  Yesterday and today, the tenderness has significantly improved.  Denies nipple discharge.  Denies trauma or bruising.  Last MMG was 07/16/18 and was a 3D.    GYNECOLOGIC HISTORY: No LMP recorded. Contraception: husband vasectomy Menopausal hormone therapy: none  Patient Active Problem List   Diagnosis Date Noted  . Dysplasia of cervix, low grade (CIN 1) 06/14/2016  . ASCUS with positive high risk HPV 03/10/2015  . Abnormal cytological findings in specimens from other organs, systems and tissues 03/10/2015  . Breast cyst 12/16/2013  . Blood in the urine 10/03/2011  . Allergic rhinitis 07/19/2011  . Anxiety 07/19/2011    Past Medical History:  Diagnosis Date  . Abnormal pap 2006   2006 ASCUS - HPV, 02-11-14 ASCUS +HPV  . Basal cell carcinoma    Dr. Wilhemina Bonito  . Environmental allergies   . Osteopenia    BMD age 15    No past surgical history on file.  MEDS:   Current Outpatient Medications on File Prior to Visit  Medication Sig Dispense Refill  . Azelastine-Fluticasone (DYMISTA) 137-50 MCG/ACT SUSP Place 1 spray into the nose daily.    Marland Kitchen levocetirizine (XYZAL) 5 MG tablet Take 5 mg by mouth every evening.    Marland Kitchen LORazepam (ATIVAN) 0.5 MG tablet Take 0.5 mg by mouth as needed for anxiety.    Marland Kitchen olopatadine (PATANOL) 0.1 % ophthalmic solution Place 1 drop into both eyes daily as needed. Reported on 05/16/2016     Current Facility-Administered Medications on File Prior to Visit  Medication Dose Route Frequency Provider Last Rate Last Dose  . 0.9 %  sodium chloride infusion  500 mL Intravenous Once Pyrtle, Lajuan Lines, MD         ALLERGIES: Levaquin [levofloxacin]; Augmentin [amoxicillin-pot clavulanate]; and Neosporin [neomycin-bacitracin zn-polymyx]  Family History  Problem Relation Age of Onset  . Heart disease Maternal Grandmother   . Diabetes Maternal Grandfather   . Heart disease Maternal Grandfather   . Heart disease Paternal Grandfather   . Atrial fibrillation Mother   . Colon polyps Father        Precancerous    SH:  Married, non smoker  Review of Systems  Constitutional: Negative.   HENT: Negative.   Eyes: Negative.   Respiratory: Negative.   Cardiovascular: Negative.   Gastrointestinal: Negative.   Endocrine: Negative.   Genitourinary: Negative.   Musculoskeletal: Negative.   Skin:       Rt breast lump, tenderness  Allergic/Immunologic: Negative.   Neurological: Negative.   Psychiatric/Behavioral: Negative.     PHYSICAL EXAMINATION:   Vitals:   04/01/19 1649  BP: 118/72  Pulse: 68  Resp: 16  Temp: 97.9 F (36.6 C)   Physical Exam  Constitutional: She is oriented to person, place, and time. She appears well-developed and well-nourished.  Neck: Normal range of motion. Neck supple. No thyromegaly present.  Respiratory: Right breast exhibits mass. Right breast exhibits no inverted nipple, no nipple discharge, no skin change and no tenderness. Left breast exhibits no inverted nipple, no mass, no nipple discharge, no skin change and no tenderness. Breasts  are symmetrical.    Lymphadenopathy:    She has no cervical adenopathy.  Neurological: She is alert and oriented to person, place, and time.  Skin: Skin is warm and dry.  Psychiatric: She has a normal mood and affect.   Chaperone was present for exam.  Assessment: Probable 3.5cm breast cyst on the right  Plan: She is going to monitor this over the next two weeks.  If resolves and/or significantly decreases in size, she will let me know and can continue to monitor.  If does not change in size, will plan to aspirate or have  aspirated with radiology after imaging.

## 2019-04-14 ENCOUNTER — Encounter: Payer: Self-pay | Admitting: Obstetrics & Gynecology

## 2019-06-13 ENCOUNTER — Encounter: Payer: Self-pay | Admitting: Obstetrics and Gynecology

## 2019-06-13 ENCOUNTER — Encounter: Payer: Self-pay | Admitting: Obstetrics & Gynecology

## 2019-06-13 ENCOUNTER — Ambulatory Visit (INDEPENDENT_AMBULATORY_CARE_PROVIDER_SITE_OTHER): Payer: BC Managed Care – PPO | Admitting: Obstetrics and Gynecology

## 2019-06-13 ENCOUNTER — Other Ambulatory Visit: Payer: Self-pay

## 2019-06-13 ENCOUNTER — Telehealth: Payer: Self-pay | Admitting: *Deleted

## 2019-06-13 ENCOUNTER — Other Ambulatory Visit: Payer: Self-pay | Admitting: *Deleted

## 2019-06-13 VITALS — BP 100/70 | HR 64 | Temp 98.2°F | Ht 61.75 in | Wt 127.0 lb

## 2019-06-13 DIAGNOSIS — N61 Mastitis without abscess: Secondary | ICD-10-CM

## 2019-06-13 DIAGNOSIS — N63 Unspecified lump in unspecified breast: Secondary | ICD-10-CM | POA: Diagnosis not present

## 2019-06-13 MED ORDER — SULFAMETHOXAZOLE-TRIMETHOPRIM 800-160 MG PO TABS: 1.0000 | ORAL_TABLET | Freq: Two times a day (BID) | ORAL | 0 refills | Status: DC

## 2019-06-13 NOTE — Patient Instructions (Signed)
Mastitis  Mastitis is irritation and swelling (inflammation) in an area of the breast. It is often caused by an infection that occurs when germs (bacteria) enter the skin. This most often happens to breastfeeding mothers, but it can happen to other women too as well as some men. Follow these instructions at home: Medicines  Take over-the-counter and prescription medicines only as told by your doctor.  If you were prescribed an antibiotic medicine, take it as told by your doctor. Do not stop taking it even if you start to feel better. General instructions  Do not wear a tight or underwire bra. Wear a soft support bra.  Drink more fluids, especially if you have a fever.  Get plenty of rest. If you are breastfeeding:   Keep emptying your breasts by breastfeeding or by using a breast pump.  Keep your nipples clean and dry.  During breastfeeding, empty the first breast before going to the other breast. Use a breast pump if your baby is not emptying your breasts.  Massage your breasts during feeding or pumping as told by your doctor.  If told, put moist heat on the affected area of your breast right before breastfeeding or pumping. Use the heat source that your doctor tells you to use.  If told, put ice on the affected area of your breast right after breastfeeding or pumping: ? Put ice in a plastic bag. ? Place a towel between your skin and the bag. ? Leave the ice on for 20 minutes.  If you go back to work, pump your breasts while at work.  Avoid letting your breasts get overly filled with milk (engorged). Contact a doctor if:  You have pus-like fluid leaking from your breast.  You have a fever.  Your symptoms do not get better within 2 days. Get help right away if:  Your pain and swelling are getting worse.  Your pain is not helped by medicine.  You have a red line going from your breast toward your armpit. Summary  Mastitis is irritation and swelling in an area of  the breast.  If you were prescribed an antibiotic medicine, do not stop taking it even if you start to feel better.  Drink more fluids and get plenty of rest.  Contact a doctor if your symptoms do not get better within 2 days. This information is not intended to replace advice given to you by your health care provider. Make sure you discuss any questions you have with your health care provider. Document Released: 10/05/2009 Document Revised: 09/29/2017 Document Reviewed: 11/08/2016 Elsevier Patient Education  2020 [employer].

## 2019-06-13 NOTE — Telephone Encounter (Signed)
Call to patient. Patient complaining of tender cyst in her left breast near her nipple. Denies fever or nipple discharge. Patient requesting an appointment to be seen. Advised Dr. Sabra Heck out of the office, but could schedule with covering provider. Patient agreeable. Patient scheduled for today at 1615 with Dr. Talbert Nan. Patient agreeable to date and time of appointment. Covid prescreening negative.  Routing to provider and will close encounter.

## 2019-06-13 NOTE — Telephone Encounter (Signed)
I have a cyst on my left breast (right at the nipple) that is painful and irritating when I wear a bra. Can I have it looked at to see if there is something we can do with it?    I was in a month or so ago for a cyst on the right side. I would also like to follow up on that one. It's not painful but still large.    Thank you!  Carrie Higgins

## 2019-06-13 NOTE — Progress Notes (Signed)
GYNECOLOGY  VISIT   HPI: 48 y.o.   Married White or Caucasian Not Hispanic or Latino  female   G1P1 with Patient's last menstrual period was 05/30/2019.   here for  R breast lump recheck and new onset L breast pain. She was seen by Dr Sabra Heck in 6/20 with a 3.5 cm suspected right breast cyst. It got significantly smaller and is no longer tender. Now she is having tenderness in her left breast, it is in the region of a known lump which goes up and down in size. The pain started a month ago, it's mild, can be random, tender to touch.  In the last few years her cycles have gotten closer together. Cycles can be 20-26 days, bleeding from 4-9 days. Can be 4-5 days of spotting prior to her cycle.   GYNECOLOGIC HISTORY: Patient's last menstrual period was 05/30/2019. Contraception: vasectomy  Menopausal hormone therapy: none        OB History    Gravida  1   Para  1   Term      Preterm      AB      Living  1     SAB      TAB      Ectopic      Multiple      Live Births                 Patient Active Problem List   Diagnosis Date Noted  . Dysplasia of cervix, low grade (CIN 1) 06/14/2016  . ASCUS with positive high risk HPV 03/10/2015  . Abnormal cytological findings in specimens from other organs, systems and tissues 03/10/2015  . Breast cyst 12/16/2013  . Blood in the urine 10/03/2011  . Allergic rhinitis 07/19/2011  . Anxiety 07/19/2011    Past Medical History:  Diagnosis Date  . Abnormal pap 2006   2006 ASCUS - HPV, 02-11-14 ASCUS +HPV  . Basal cell carcinoma    Dr. Wilhemina Bonito  . Environmental allergies   . Osteopenia    BMD age 6    History reviewed. No pertinent surgical history.  Current Outpatient Medications  Medication Sig Dispense Refill  . Azelastine-Fluticasone (DYMISTA) 137-50 MCG/ACT SUSP Place 1 spray into the nose daily.    Marland Kitchen FLUoxetine (PROZAC) 20 MG capsule TAKE ONE CAPSULE (20 MG DOSE) BY MOUTH DAILY.    Marland Kitchen levocetirizine (XYZAL) 5 MG  tablet Take 5 mg by mouth every evening.    Marland Kitchen LORazepam (ATIVAN) 0.5 MG tablet Take 0.5 mg by mouth as needed for anxiety.    Marland Kitchen olopatadine (PATANOL) 0.1 % ophthalmic solution Place 1 drop into both eyes daily as needed. Reported on 05/16/2016     Current Facility-Administered Medications  Medication Dose Route Frequency Provider Last Rate Last Dose  . 0.9 %  sodium chloride infusion  500 mL Intravenous Once Pyrtle, Lajuan Lines, MD         ALLERGIES: Levaquin [levofloxacin], Augmentin [amoxicillin-pot clavulanate], and Neosporin [neomycin-bacitracin zn-polymyx]  Family History  Problem Relation Age of Onset  . Heart disease Maternal Grandmother   . Diabetes Maternal Grandfather   . Heart disease Maternal Grandfather   . Heart disease Paternal Grandfather   . Atrial fibrillation Mother   . Colon polyps Father        Precancerous    Social History   Socioeconomic History  . Marital status: Married    Spouse name: Not on file  . Number of children: 1  .  Years of education: Not on file  . Highest education level: Not on file  Occupational History  . Not on file  Social Needs  . Financial resource strain: Not on file  . Food insecurity    Worry: Not on file    Inability: Not on file  . Transportation needs    Medical: Not on file    Non-medical: Not on file  Tobacco Use  . Smoking status: Never Smoker  . Smokeless tobacco: Never Used  Substance and Sexual Activity  . Alcohol use: Yes    Alcohol/week: 3.0 standard drinks    Types: 3 Standard drinks or equivalent per week  . Drug use: No  . Sexual activity: Yes    Partners: Male    Birth control/protection: Other-see comments    Comment: Husband had vasectomy  Lifestyle  . Physical activity    Days per week: Not on file    Minutes per session: Not on file  . Stress: Not on file  Relationships  . Social Herbalist on phone: Not on file    Gets together: Not on file    Attends religious service: Not on file     Active member of club or organization: Not on file    Attends meetings of clubs or organizations: Not on file    Relationship status: Not on file  . Intimate partner violence    Fear of current or ex partner: Not on file    Emotionally abused: Not on file    Physically abused: Not on file    Forced sexual activity: Not on file  Other Topics Concern  . Not on file  Social History Narrative  . Not on file    Review of Systems  Constitutional: Negative.   HENT: Negative.   Eyes: Negative.   Respiratory: Negative.   Cardiovascular: Negative.   Gastrointestinal: Negative.   Genitourinary:       Breast pain more frequent menstrual bleeding   Musculoskeletal: Negative.   Skin: Negative.   Neurological: Negative.   Endo/Heme/Allergies: Negative.   Psychiatric/Behavioral: Negative.   All other systems reviewed and are negative.   PHYSICAL EXAMINATION:    BP 100/70   Pulse 64   Temp 98.2 F (36.8 C) (Temporal)   Ht 5' 1.75" (1.568 m)   Wt 127 lb (57.6 kg)   LMP 05/30/2019   BMI 23.42 kg/m     General appearance: alert, cooperative and appears stated age Breasts: In the left breast at 3 o'clock at the edge of the areolar region is a 2 cm tender lump with mild surrounding erythema. In the right breast there is a 2-3 cm lump at 9 olclock just outside the areolar region and a 1 cm nodular lump at 10 o'clock 1 cm from the areolar region. Patient examined supine and sitting.  No axillary or supraclavicular adenopathy   ASSESSMENT Bilateral breast lumps Mastitis    PLAN Start bactrim DS, call with worsening symptoms or fever. Can use Ibuprofen or tylenol as needed Diagnostic breast imaging F/U next week with Dr Sabra Heck   An After Visit Summary was printed and given to the patient.    CC: Dr Sabra Heck

## 2019-06-14 ENCOUNTER — Telehealth: Payer: Self-pay | Admitting: *Deleted

## 2019-06-14 NOTE — Telephone Encounter (Signed)
Reviewed appointment with Dr Talbert Nan. Call to patient. Advised of current option at [facility]. Offered option to check at [facility] for [place] appointment. Patient states has had two doses of antibiotics and "no worse."  States she hasn't felt bad.  Declines appointment at [facility]. Will continue antibiotics and keep currently scheduled appointment for follow-up on Tuesday, 06-18-19 with Dr Sabra Heck and breast imaging at 2:10 on 06-18-19.  Advised to call back if any change in status.   Routing to Dr Talbert Nan. Encounter closed.

## 2019-06-14 NOTE — Telephone Encounter (Signed)
Incoming call received from [place] at [facility]. States she spoke with Gay Filler late yesterday afternoon in regards to scheduling this patient for stat bilateral diagnostic breast imaging. States they can't get the patient in before her scheduled appointment on Tuesday 06-18-2019 at 1410. RN advised would update Dr. Talbert Nan who is covering for Dr. Sabra Heck. Patient is scheduled to see Dr. Sabra Heck on 06-18-2019 at 1100.   Routing to Dr. Talbert Nan to review and advise.

## 2019-06-17 ENCOUNTER — Encounter: Payer: Self-pay | Admitting: Obstetrics & Gynecology

## 2019-06-17 ENCOUNTER — Telehealth: Payer: Self-pay | Admitting: Obstetrics & Gynecology

## 2019-06-17 NOTE — Telephone Encounter (Signed)
Call to patient. Patient states that she feels "about the same as when I saw Dr. Talbert Nan." Still having tenderness and just feels "annoying" wearing a bra. Patient states she started the antibiotic Thursday night and "hasn't really noticed a difference." Reviewed with Dr. Sabra Heck, will cancel am appointment here and patient will call to follow up after her appointment at [place]. Patient agreeable with plan.   Routing to provider and will close encounter.

## 2019-06-17 NOTE — Telephone Encounter (Signed)
Patient sent the following correspondence through [place].  I have an appointment tomorrow and also a mammogram with ultrasound tomorrow afternoon. Should we reschedule my appointment with Dr Sabra Heck until after I go to the breast center? When the appointment was originally booked Dr Talbert Nan thought I would be going to the breast center first.

## 2019-06-18 ENCOUNTER — Encounter: Payer: Self-pay | Admitting: Obstetrics & Gynecology

## 2019-06-18 ENCOUNTER — Ambulatory Visit
Admission: RE | Admit: 2019-06-18 | Discharge: 2019-06-18 | Disposition: A | Discharge status: Home / Self Care | Payer: BLUE CROSS/BLUE SHIELD | Source: Ambulatory Visit | Attending: Obstetrics and Gynecology | Admitting: Obstetrics and Gynecology

## 2019-06-18 ENCOUNTER — Other Ambulatory Visit: Payer: Self-pay

## 2019-06-18 ENCOUNTER — Other Ambulatory Visit: Payer: Self-pay | Admitting: Obstetrics and Gynecology

## 2019-06-18 ENCOUNTER — Ambulatory Visit: Payer: BC Managed Care – PPO | Admitting: Obstetrics & Gynecology

## 2019-06-18 ENCOUNTER — Ambulatory Visit
Admission: RE | Admit: 2019-06-18 | Discharge: 2019-06-18 | Disposition: A | Discharge status: Home / Self Care | Payer: PRIVATE HEALTH INSURANCE | Source: Ambulatory Visit | Attending: Obstetrics and Gynecology | Admitting: Obstetrics and Gynecology

## 2019-06-18 DIAGNOSIS — N63 Unspecified lump in unspecified breast: Secondary | ICD-10-CM

## 2019-06-18 DIAGNOSIS — N61 Mastitis without abscess: Secondary | ICD-10-CM

## 2019-06-18 DIAGNOSIS — N6002 Solitary cyst of left breast: Secondary | ICD-10-CM

## 2019-06-19 ENCOUNTER — Ambulatory Visit
Admission: RE | Admit: 2019-06-19 | Discharge: 2019-06-19 | Disposition: A | Discharge status: Home / Self Care | Payer: PRIVATE HEALTH INSURANCE | Source: Ambulatory Visit | Attending: Obstetrics and Gynecology | Admitting: Obstetrics and Gynecology

## 2019-06-19 DIAGNOSIS — N6002 Solitary cyst of left breast: Secondary | ICD-10-CM

## 2019-06-20 ENCOUNTER — Telehealth: Payer: Self-pay | Admitting: Obstetrics & Gynecology

## 2019-06-20 NOTE — Telephone Encounter (Signed)
Spoke with patient. Seen in office on 06/13/19 for left breast lump and pain. Left breast aspiration done on 8/19. Denies redness, warmth or fever/chills. Describes numbness at site of aspiration. Size of lump reduced after aspiration, but no change since yesterday, expected area to be flat by now. Still taking bactrim as prescribed. Patient asking of f/u is needed?   Advised can take a few days to completely resolve. Complete abx as prescribed, continue to monitor. If symptoms do not resolve or new symptoms develop, such as increased pain, fever/chills, increase in size, redness,  drainage, warmth at site, will need f/u OV. Advised I will review with Dr. Talbert Nan and return call. Patient agreeable.   Dr. Talbert Nan, please review.   Cc: Dr. Sabra Heck

## 2019-06-20 NOTE — Telephone Encounter (Signed)
Plan OV today or tomorrow if possible.

## 2019-06-20 NOTE — Telephone Encounter (Signed)
Patient sent the following correspondence through [place].  I had the procedure late yesterday afternoon. It seemed to go well. I do however feel that the spot is not completely flat like I expected it would be.  No tenderness but just still slightly raised. Let me know if you think I should come in.

## 2019-06-20 NOTE — Telephone Encounter (Signed)
Spoke with patient. OV scheduled for 8/21 at 2:30 pm with Dr. Sabra Heck.   Routing to provider for final review. Patient is agreeable to disposition. Will close encounter.

## 2019-06-20 NOTE — Telephone Encounter (Signed)
I agree with your plan 

## 2019-06-21 ENCOUNTER — Encounter: Payer: Self-pay | Admitting: Obstetrics & Gynecology

## 2019-06-21 ENCOUNTER — Other Ambulatory Visit: Payer: Self-pay

## 2019-06-21 ENCOUNTER — Ambulatory Visit (INDEPENDENT_AMBULATORY_CARE_PROVIDER_SITE_OTHER): Payer: BC Managed Care – PPO | Admitting: Obstetrics & Gynecology

## 2019-06-21 VITALS — BP 110/70 | HR 76 | Temp 97.2°F | Ht 61.75 in | Wt 127.0 lb

## 2019-06-21 DIAGNOSIS — N6001 Solitary cyst of right breast: Secondary | ICD-10-CM

## 2019-06-21 DIAGNOSIS — N6002 Solitary cyst of left breast: Secondary | ICD-10-CM

## 2019-06-21 NOTE — Progress Notes (Signed)
GYNECOLOGY  VISIT  CC:   Breast recheck  HPI: 48 y.o. G1P1 Married White or Caucasian female here for breast recheck after having breast cyst aspirated yesterday.  Fluid was not sent for cytology.  Was specifically advised cyst fluid appeared normal.  Is almost finished with antibiotics.  Redness on breast has resolved.  Tenderness/pain has resolved.  Denies nipple discharge.  Denies fever.  Not sure cyst was fully drained.  Would like my assessment.  Area that she is feeling on the breast is not tender.  GYNECOLOGIC HISTORY: Patient's last menstrual period was 05/30/2019. Contraception: vasectomy  Menopausal hormone therapy: none  Patient Active Problem List   Diagnosis Date Noted  . Dysplasia of cervix, low grade (CIN 1) 06/14/2016  . ASCUS with positive high risk HPV 03/10/2015  . Abnormal cytological findings in specimens from other organs, systems and tissues 03/10/2015  . Breast cyst 12/16/2013  . Blood in the urine 10/03/2011  . Allergic rhinitis 07/19/2011  . Anxiety 07/19/2011    Past Medical History:  Diagnosis Date  . Abnormal pap 2006   2006 ASCUS - HPV, 02-11-14 ASCUS +HPV  . Basal cell carcinoma    Dr. Wilhemina Bonito  . Environmental allergies   . Osteopenia    BMD age 66    History reviewed. No pertinent surgical history.  MEDS:   Current Outpatient Medications on File Prior to Visit  Medication Sig Dispense Refill  . Azelastine-Fluticasone (DYMISTA) 137-50 MCG/ACT SUSP Place 1 spray into the nose daily.    Marland Kitchen FLUoxetine (PROZAC) 20 MG capsule TAKE ONE CAPSULE (20 MG DOSE) BY MOUTH DAILY.    Marland Kitchen levocetirizine (XYZAL) 5 MG tablet Take 5 mg by mouth every evening.    Marland Kitchen LORazepam (ATIVAN) 0.5 MG tablet Take 0.5 mg by mouth as needed for anxiety.    Marland Kitchen olopatadine (PATANOL) 0.1 % ophthalmic solution Place 1 drop into both eyes daily as needed. Reported on 05/16/2016    . sulfamethoxazole-trimethoprim (BACTRIM DS) 800-160 MG tablet Take 1 tablet by mouth 2 (two) times  daily. One PO BID x 10 days 20 tablet 0   Current Facility-Administered Medications on File Prior to Visit  Medication Dose Route Frequency Provider Last Rate Last Dose  . 0.9 %  sodium chloride infusion  500 mL Intravenous Once Pyrtle, Lajuan Lines, MD        ALLERGIES: Levaquin [levofloxacin], Augmentin [amoxicillin-pot clavulanate], and Neosporin [neomycin-bacitracin zn-polymyx]  Family History  Problem Relation Age of Onset  . Heart disease Maternal Grandmother   . Diabetes Maternal Grandfather   . Heart disease Maternal Grandfather   . Heart disease Paternal Grandfather   . Atrial fibrillation Mother   . Colon polyps Father        Precancerous    SH:  Married, non smoker  Review of Systems  All other systems reviewed and are negative.   PHYSICAL EXAMINATION:    BP 110/70   Pulse 76   Temp (!) 97.2 F (36.2 C) (Temporal)   Ht 5' 1.75" (1.568 m)   Wt 127 lb (57.6 kg)   LMP 05/30/2019   BMI 23.42 kg/m     Physical Exam  Constitutional: She is oriented to person, place, and time. She appears well-developed and well-nourished.  Respiratory: Right breast exhibits mass. Right breast exhibits no inverted nipple, no nipple discharge, no skin change and no tenderness. Left breast exhibits mass. Left breast exhibits no inverted nipple, no nipple discharge, no skin change and no tenderness. Breasts are symmetrical.  Lymphadenopathy:    She has no axillary adenopathy.       Right: No supraclavicular adenopathy present.       Left: No supraclavicular adenopathy present.  Neurological: She is alert and oriented to person, place, and time.  Skin: Skin is warm and dry.  Psychiatric: She has a normal mood and affect.   Chaperone was present for exam.  Assessment: Bilateral breast cysts noted Resolution of breast erythema/possible mastitis  Plan: Pt will monitor and call if has any changes including new tenderness or skin redness.     ~15 minutes spent with patient >50% of  time was in face to face discussion of above.

## 2019-08-02 ENCOUNTER — Telehealth: Payer: Self-pay | Admitting: *Deleted

## 2019-08-02 NOTE — Telephone Encounter (Signed)
06/18/19 bilateral Dx MMG for bilateral breast, bilateral US and left breast aspiration completed. Screening recommended 1 year. Seen in office on 06/21/19 for f/u. Resolution of breast erythema/possible mastitis.    Dr. Sabra Heck -ok to remove from [facility] hold?

## 2019-08-05 NOTE — Telephone Encounter (Signed)
Yes, remove from [facility] hold.  Thanks.

## 2019-08-08 NOTE — Telephone Encounter (Signed)
Patient removed from [facility] hold.   Encounter closed.  

## 2019-10-17 ENCOUNTER — Telehealth: Payer: Self-pay | Admitting: Obstetrics & Gynecology

## 2019-10-17 ENCOUNTER — Encounter: Payer: Self-pay | Admitting: Obstetrics & Gynecology

## 2019-10-17 NOTE — Telephone Encounter (Signed)
Reviewed Epic and Care Everywhere, ABO/Rh on file.   Call returned to patient, advised no blood type on file. Reviewed with patient when blood type may be drawn and how to request records prior to EMR or previous providers.  Patient thankful for assistance, questions answered.   Routing to provider for final review. Patient is agreeable to disposition. Will close encounter.

## 2019-10-17 NOTE — Telephone Encounter (Signed)
Patient sent the following message through [place]. Routing to triage to assist patient with request.  [place], [place]  Phone Number: [phone]  Do you all have record of my blood type?

## 2019-11-08 ENCOUNTER — Other Ambulatory Visit: Payer: Self-pay

## 2019-11-12 ENCOUNTER — Encounter: Payer: Self-pay | Admitting: Obstetrics & Gynecology

## 2019-11-12 ENCOUNTER — Other Ambulatory Visit: Payer: Self-pay

## 2019-11-12 ENCOUNTER — Ambulatory Visit (INDEPENDENT_AMBULATORY_CARE_PROVIDER_SITE_OTHER): Payer: BLUE CROSS/BLUE SHIELD | Admitting: Obstetrics & Gynecology

## 2019-11-12 VITALS — BP 102/60 | HR 72 | Temp 97.5°F | Resp 12 | Ht 61.25 in | Wt 130.0 lb

## 2019-11-12 DIAGNOSIS — Z01419 Encounter for gynecological examination (general) (routine) without abnormal findings: Secondary | ICD-10-CM

## 2019-11-12 DIAGNOSIS — N926 Irregular menstruation, unspecified: Secondary | ICD-10-CM | POA: Diagnosis not present

## 2019-11-12 DIAGNOSIS — Z87448 Personal history of other diseases of urinary system: Secondary | ICD-10-CM

## 2019-11-12 LAB — POCT URINALYSIS DIPSTICK
Bilirubin, UA: NEGATIVE
Blood, UA: 2
Glucose, UA: NEGATIVE
Ketones, UA: NEGATIVE
Nitrite, UA: NEGATIVE
Protein, UA: POSITIVE — AB
Urobilinogen, UA: 0.2 E.U./dL
pH, UA: 8 (ref 5.0–8.0)

## 2019-11-12 NOTE — Progress Notes (Signed)
49 y.o. G1P1 Married White or Caucasian female here for annual exam. Patient requested her urine to be tested as she has a history of hematuria.  Has seen urology in the past and evaluation was negative.    Cycles are changing.  She is sure she missed a cycle in the summer.  She is having spotting for a few days that stops and then it is three to four days before her cycles start.  She has cycles that are four weeks apart but have also been 6 and 8 weeks apart.  She thinks there is more spotting and her flow is heavier when she cycle is late.    Patient's last menstrual period was 10/24/2019.          Sexually active: Yes.    The current method of family planning is vasectomy.    Exercising: Yes.    weights and cardio Smoker:  no  Health Maintenance: Pap:   08/07/18 Neg:neg HR HPV  05/30/17 Neg. HR HPV:neg              05/16/16 ASCUS. HR HPV:neg  History of abnormal Pap:  Yes, LEEP CIN I MMG:  06/18/19 Diagnostic MM/Bilateral US - BIRADS 2 benign c Colonoscopy:  11/09/17 Polyps removed f/u 3 years BMD:   2007 Osteopenia TDaP:  12/04/17 Pneumonia vaccine(s):  n/a Shingrix:   n/a Screening Labs: PCP   reports that she has never smoked. She has never used smokeless tobacco. She reports current alcohol use of about 3.0 standard drinks of alcohol per week. She reports that she does not use drugs.  Past Medical History:  Diagnosis Date  . Abnormal pap 2006   2006 ASCUS - HPV, 02-11-14 ASCUS +HPV  . Basal cell carcinoma    Dr. Wilhemina Bonito  . Environmental allergies   . Osteopenia    BMD age 52    History reviewed. No pertinent surgical history.  Current Outpatient Medications  Medication Sig Dispense Refill  . Azelastine-Fluticasone (DYMISTA) 137-50 MCG/ACT SUSP Place 1 spray into the nose daily.    Marland Kitchen FLUoxetine (PROZAC) 20 MG capsule TAKE ONE CAPSULE (20 MG DOSE) BY MOUTH DAILY.    Marland Kitchen levocetirizine (XYZAL) 5 MG tablet Take 5 mg by mouth every evening.    Marland Kitchen LORazepam (ATIVAN) 0.5 MG tablet  Take 0.5 mg by mouth as needed for anxiety.    Marland Kitchen olopatadine (PATANOL) 0.1 % ophthalmic solution Place 1 drop into both eyes daily as needed. Reported on 05/16/2016     Current Facility-Administered Medications  Medication Dose Route Frequency Provider Last Rate Last Admin  . 0.9 %  sodium chloride infusion  500 mL Intravenous Once Pyrtle, Lajuan Lines, MD        Family History  Problem Relation Age of Onset  . Heart disease Maternal Grandmother   . Diabetes Maternal Grandfather   . Heart disease Maternal Grandfather   . Heart disease Paternal Grandfather   . Atrial fibrillation Mother   . Colon polyps Father        Precancerous    Review of Systems  All other systems reviewed and are negative.   Exam:   BP 102/60 (BP Location: Right Arm, Patient Position: Sitting, Cuff Size: Normal)   Pulse 72   Temp (!) 97.5 F (36.4 C) (Temporal)   Resp 12   Ht 5' 1.25" (1.556 m)   Wt 130 lb (59 kg)   LMP 10/24/2019   BMI 24.36 kg/m     Height: 5' 1.25" (  155.6 cm)  Ht Readings from Last 3 Encounters:  11/12/19 5' 1.25" (1.556 m)  06/21/19 5' 1.75" (1.568 m)  06/13/19 5' 1.75" (1.568 m)    General appearance: alert, cooperative and appears stated age Head: Normocephalic, without obvious abnormality, atraumatic Neck: no adenopathy, supple, symmetrical, trachea midline and thyroid normal to inspection and palpation Lungs: clear to auscultation bilaterally Breasts: normal appearance, no masses or tenderness Heart: regular rate and rhythm Abdomen: soft, non-tender; bowel sounds normal; no masses,  no organomegaly Extremities: extremities normal, atraumatic, no cyanosis or edema Skin: Skin color, texture, turgor normal. No rashes or lesions Lymph nodes: Cervical, supraclavicular, and axillary nodes normal. No abnormal inguinal nodes palpated Neurologic: Grossly normal  Pelvic: External genitalia:  no lesions              Urethra:  normal appearing urethra with no masses, tenderness or  lesions              Bartholins and Skenes: normal                 Vagina: normal appearing vagina with normal color and discharge, no lesions              Cervix: no lesions              Pap taken: No. Bimanual Exam:  Uterus:  normal size, contour, position, consistency, mobility, non-tender              Adnexa: normal adnexa and no mass, fullness, tenderness               Rectovaginal: Confirms               Anus:  normal sphincter tone, no lesions  Chaperone, Terence Lux, CMA, was present for exam.  A:  Well Woman with normal exam H/o breast cysts H/o ASCUS pap with +HR HPV with LEEP 8/17 with CIN 1 Family hx of colon polyps and personally hx of adenomatous polyps Irregular menstrual bleeding with increased spotting this past year  P:   Mammogram guidelines pap smear with neg HR HPV 2018 and 2019.  ASCCP guidelines for this pt with ASCUS pap, LEEP and CIN 1 is three years Pt is aware colonoscopy is due next year Urine micro pending Return for PUS She will have lab work done with PCP in the spring, already scheduled BMD in mid 50's discussed return annually or prn

## 2019-11-13 LAB — URINALYSIS, MICROSCOPIC ONLY
Casts: NONE SEEN /lpf
Epithelial Cells (non renal): >10 /hpf — AB (ref 0–10)

## 2019-11-14 ENCOUNTER — Encounter: Payer: Self-pay | Admitting: Obstetrics & Gynecology

## 2019-11-21 ENCOUNTER — Other Ambulatory Visit: Payer: BLUE CROSS/BLUE SHIELD

## 2019-11-21 ENCOUNTER — Ambulatory Visit (INDEPENDENT_AMBULATORY_CARE_PROVIDER_SITE_OTHER): Payer: BLUE CROSS/BLUE SHIELD

## 2019-11-21 ENCOUNTER — Encounter: Payer: Self-pay | Admitting: Obstetrics & Gynecology

## 2019-11-21 ENCOUNTER — Other Ambulatory Visit: Payer: Self-pay

## 2019-11-21 ENCOUNTER — Ambulatory Visit (INDEPENDENT_AMBULATORY_CARE_PROVIDER_SITE_OTHER): Payer: BLUE CROSS/BLUE SHIELD | Admitting: Obstetrics & Gynecology

## 2019-11-21 VITALS — BP 100/58 | HR 60 | Temp 97.3°F | Ht 62.0 in | Wt 130.2 lb

## 2019-11-21 DIAGNOSIS — N83202 Unspecified ovarian cyst, left side: Secondary | ICD-10-CM

## 2019-11-21 DIAGNOSIS — N926 Irregular menstruation, unspecified: Secondary | ICD-10-CM

## 2019-11-21 DIAGNOSIS — D251 Intramural leiomyoma of uterus: Secondary | ICD-10-CM | POA: Diagnosis not present

## 2019-11-21 NOTE — Progress Notes (Signed)
49 y.o. G1P1 Married White or Caucasian female here for pelvic ultrasound due to irregular menstrual cycle flow.  Pt has missed one cycle this summer and her cycle is due any day now but she doesn't feel like it is close to starting.  She is having more spotting with spotting three to four days before her cycle starts.  Recommended PUS to evaluated endometrium.  Patient's last menstrual period was 10/24/2019.  Contraception: vasectomy  Findings:  UTERUS: 8.1 x 5.9 x 4.4cm with four small fibroids measuring 0.9cm, 1.3cm, 1.1cm and 1.7cm EMS: 6.5cm, symmetric and avascular ADNEXA: Left ovary: 4.0 x 2.1 x 2.1 with 2.4 x 1.3 x 1.9cm cyst with internal echoes c/w hemorrhagic cyst, second collapsed corpus luteal cyst noted measuring 1.3cm.  All cysts are avascular       Right ovary: 2.0 x 1.0 x 1.2cm CUL DE SAC: no free fluid  Discussion:  Findings discussed and images reviewed.  Endometrial thickness is appropriate and there is no increased vascularity.  Ovarian finding on the left specifically reviewed as well.  There is a probable hemorrhagia cyst and corpus luteal cyst noted.  These are avascular and as she is still cycling, feel this does not need repeating imaging at this time.  Small fibroids discussed as well.  Benign nature of fibroids discussed.  questions answered.   Assessment:  Perimenopausal bleeding changes Uterine fibroids Left ovarian hemorrhagia cyst and corpus luteal cyst  Plan:  D/w pt treatment with POPs or other progesterone methods discussed.  She is not really interested in doing anything at this time but will continue to monitor bleeding and communicate with me with me if has further changes with increased spotting or heavier flow.    ~20 minutes spent with patient >50% of time was in face to face discussion of above.

## 2019-11-29 ENCOUNTER — Ambulatory Visit: Payer: BLUE CROSS/BLUE SHIELD | Admitting: Obstetrics & Gynecology

## 2020-01-25 ENCOUNTER — Ambulatory Visit: Payer: Self-pay | Attending: Internal Medicine

## 2020-01-25 DIAGNOSIS — Z23 Encounter for immunization: Secondary | ICD-10-CM

## 2020-01-25 NOTE — Progress Notes (Signed)
   Covid-19 Vaccination Clinic  Name:  Carrie Higgins    MRN: [id number] DOB: Mar 09, 1971  01/25/2020  Carrie Higgins was observed post Covid-19 immunization for 15 minutes without incident. She was provided with Vaccine Information Sheet and instruction to access the V-Safe system.   Carrie Higgins was instructed to call 911 with any severe reactions post vaccine: Marland Kitchen Difficulty breathing  . Swelling of face and throat  . A fast heartbeat  . A bad rash all over body  . Dizziness and weakness   Immunizations Administered    Name Date Dose VIS Date Route   Pfizer COVID-19 Vaccine 01/25/2020 10:03 AM 0.3 mL 10/11/2019 Intramuscular   Manufacturer: [place]   Lot: [id number]   [place]: [id number]

## 2020-02-18 ENCOUNTER — Ambulatory Visit: Payer: Self-pay | Attending: Internal Medicine

## 2020-02-18 ENCOUNTER — Ambulatory Visit: Payer: Self-pay

## 2020-02-18 DIAGNOSIS — Z23 Encounter for immunization: Secondary | ICD-10-CM

## 2020-02-18 NOTE — Progress Notes (Signed)
   Covid-19 Vaccination Clinic  Name:  [name]    MRN: [id number] DOB: 02-11-1971  02/18/2020  [name] was observed post Covid-19 immunization for 15 minutes without incident. She was provided with Vaccine Information Sheet and instruction to access the V-Safe system.   [name] was instructed to call 911 with any severe reactions post vaccine: Marland Kitchen Difficulty breathing  . Swelling of face and throat  . A fast heartbeat  . A bad rash all over body  . Dizziness and weakness   Immunizations Administered    Name Date Dose VIS Date Route   Pfizer COVID-19 Vaccine 02/18/2020 11:45 AM 0.3 mL 12/25/2018 Intramuscular   Manufacturer: [place]   Lot: [id number]   [place]: [id number]

## 2020-06-01 ENCOUNTER — Telehealth: Payer: Self-pay | Admitting: Obstetrics & Gynecology

## 2020-06-01 ENCOUNTER — Encounter: Payer: Self-pay | Admitting: Obstetrics & Gynecology

## 2020-06-01 NOTE — Telephone Encounter (Signed)
Spoke with patient. Patient reports menses have been the same since last AEX and PUS in 11/2019 until this last cycle. Reports 2 menses in 14 days. LMP 05/28/20 to current, still spotting. Bleeding was heavier for her, changing regular tampon q4 hours on the heaviest days. Reports lightheadedness while playing golf over the weekend outdoors, this has since resolved. Denies pain, fever/chills, N/V or any other signs of anemia. Spouse vasectomy for contraceptive. Discussed POP and progesterone as treatment options at last OV, has additional questions and may need OV to further discuss if this is recommended.   Advised patient I will provide update to Dr. Sabra Heck and return call with recommendations, patient agreeable.   Dr. Sabra Heck -please review and advise.

## 2020-06-01 NOTE — Telephone Encounter (Signed)
Carrie Higgins  P [facility] I just had two periods only 14 days apart. Just wanted to message you about this as a follow up to my ultrasound earlier this year.

## 2020-06-02 NOTE — Telephone Encounter (Signed)
I think ok to start progesterone only pill at this time if she is ok with this.  Would be one tablet daily.  If has lots of questions, please schedule OV.  If not, can send rx to pharmacy for 1 month supply/2RF.  Recheck 3 months if starts POPs and doesn't need OV to discuss.  Thanks.

## 2020-06-02 NOTE — Telephone Encounter (Signed)
Spoke with patient, advised per Dr. Sabra Heck. Patient asking about common side effects of POP, review with patient per Up To Date. Reviewed with patient how POP is taken.   Patient declines POP at this time, patient declines OV at this time.   Advised patient to continue to monitor bleeding, if bleeding continues to be irregular, becomes heavier or new symptoms develop, return call to office for OV. Advised I will provide update to Dr. Sabra Heck and return call if any additional recommendations, patient agreeable.   Routing to Dr. Sabra Heck for final review.

## 2020-06-12 ENCOUNTER — Other Ambulatory Visit: Payer: Self-pay | Admitting: Obstetrics & Gynecology

## 2020-06-12 DIAGNOSIS — Z1231 Encounter for screening mammogram for malignant neoplasm of breast: Secondary | ICD-10-CM

## 2020-06-24 ENCOUNTER — Ambulatory Visit
Admission: RE | Admit: 2020-06-24 | Discharge: 2020-06-24 | Disposition: A | Discharge status: Home / Self Care | Payer: BLUE CROSS/BLUE SHIELD | Source: Ambulatory Visit

## 2020-06-24 ENCOUNTER — Other Ambulatory Visit: Payer: Self-pay

## 2020-06-24 DIAGNOSIS — Z1231 Encounter for screening mammogram for malignant neoplasm of breast: Secondary | ICD-10-CM

## 2020-08-17 ENCOUNTER — Encounter: Payer: Self-pay | Admitting: Obstetrics & Gynecology

## 2020-08-18 ENCOUNTER — Telehealth: Payer: Self-pay

## 2020-08-18 NOTE — Telephone Encounter (Signed)
Pt sent following mychart message:  Carrie Higgins, Carrie Higgins [facility] Clinical Pool Can I please schedule a visit (virtual or in person) to discuss my options for irregular periods? I know the progesterone pill is an option but I would like to discuss it first.

## 2020-08-18 NOTE — Progress Notes (Signed)
GYNECOLOGY  VISIT  CC:   Irregular bleeding  HPI: 49 y.o. G1P1 Married White or Caucasian female here for irregular bleeding that pt states really changed this summer.  Cycles in July, August and September were about 15 days apart.  Prior to that time cycles were about 21 to 22 days.  She would have spotting for a few days and then have regular bleeding that lasted about 4-5 days.  In the summer, there was no spotting just straight into regular bleeding.  This month, she had a cycle that was about 21 days from the one in September.  She did not have much spotting prior to her cycle.  Heaviest bleeding day requires a super tampon that needs to be changed every 3-4 hours.  This bleeding typically lasts about 2 days.    Last pap 08/07/2018 neg with neg HR HPV.  GYNECOLOGIC HISTORY: Patient's last menstrual period was 08/15/2020 (exact date). Contraception: husband vasectomy Menopausal hormone therapy: none  Patient Active Problem List   Diagnosis Date Noted  . Dysplasia of cervix, low grade (CIN 1) 06/14/2016  . ASCUS with positive high risk HPV 03/10/2015  . Breast cyst 12/16/2013  . Hematuria 10/03/2011  . Allergic rhinitis 07/19/2011  . Anxiety 07/19/2011    Past Medical History:  Diagnosis Date  . Abnormal pap 2006   2006 ASCUS - HPV, 02-11-14 ASCUS +HPV  . Basal cell carcinoma    Dr. Wilhemina Bonito  . Environmental allergies   . Osteopenia    BMD age 37    History reviewed. No pertinent surgical history.  MEDS:   Current Outpatient Medications on File Prior to Visit  Medication Sig Dispense Refill  . Azelastine-Fluticasone (DYMISTA) 137-50 MCG/ACT SUSP Place 1 spray into the nose daily.    Marland Kitchen FLUoxetine (PROZAC) 20 MG capsule TAKE ONE CAPSULE (20 MG DOSE) BY MOUTH DAILY.    . fluticasone (FLONASE) 50 MCG/ACT nasal spray Place into both nostrils.    Marland Kitchen levocetirizine (XYZAL) 5 MG tablet Take 5 mg by mouth every evening.    Marland Kitchen LORazepam (ATIVAN) 0.5 MG tablet Take 0.5 mg by mouth as  needed for anxiety.    Marland Kitchen olopatadine (PATANOL) 0.1 % ophthalmic solution Place 1 drop into both eyes daily as needed. Reported on 05/16/2016     Current Facility-Administered Medications on File Prior to Visit  Medication Dose Route Frequency Provider Last Rate Last Admin  . 0.9 %  sodium chloride infusion  500 mL Intravenous Once Pyrtle, Lajuan Lines, MD        ALLERGIES: Levaquin [levofloxacin], Augmentin [amoxicillin-pot clavulanate], and Neosporin [neomycin-bacitracin zn-polymyx]  Family History  Problem Relation Age of Onset  . Heart disease Maternal Grandmother   . Diabetes Maternal Grandfather   . Heart disease Maternal Grandfather   . Heart disease Paternal Grandfather   . Atrial fibrillation Mother   . Colon polyps Father        Precancerous    SH:  Married, non smoker  Review of Systems  Constitutional: Negative.   HENT: Negative.   Eyes: Negative.   Respiratory: Negative.   Cardiovascular: Negative.   Gastrointestinal: Negative.   Endocrine: Negative.   Genitourinary:       Irregular bleeding  Musculoskeletal: Negative.   Skin: Negative.   Allergic/Immunologic: Negative.   Neurological: Negative.   Hematological: Negative.   Psychiatric/Behavioral: Negative.     PHYSICAL EXAMINATION:    BP 102/62   Pulse 68   Resp 16   Wt 133 lb (60.3  kg)   LMP 08/15/2020 (Exact Date)   BMI 24.33 kg/m     General appearance: alert, cooperative and appears stated age Abdomen: soft, non-tender; bowel sounds normal; no masses,  no organomegaly Lymph:  no inguinal LAD noted  Pelvic: External genitalia:  no lesions              Urethra:  normal appearing urethra with no masses, tenderness or lesions              Bartholins and Skenes: normal                 Vagina: normal appearing vagina with normal color and discharge, no lesions              Cervix: no lesions              Bimanual Exam:  Uterus:  normal size, contour, position, consistency, mobility, non-tender               Adnexa: no mass, fullness, tenderness  Endometrial biopsy recommended.  Discussed with patient.  Verbal and written consent obtained.   Procedure:  Speculum placed.  Cervix visualized and cleansed with betadine prep.  A single toothed tenaculum was applied to the anterior lip of the cervix.  Endometrial pipelle was advanced through the cervix into the endometrial cavity without difficulty.  Pipelle passed to 8cm.  Suction applied and pipelle removed with good tissue sample obtained.  Tenculum removed.  No bleeding noted.  Patient tolerated procedure well.  Chaperone, Royal Hawthorn, CMA, was present for exam.  Assessment: Irregular bleeding  Plan: Endometrial biopsy pending FSH obtained today Depending on results will start micronor 1 po q day.  #1 month/5 RF.

## 2020-08-18 NOTE — Telephone Encounter (Signed)
Jan 2021- PUS and AEX H/o Bil. Breast cysts  Spoke with pt. Pt states still continuing to have irregular cycles/bleeding. Pt states skipped cycle in July 2021 and then started to have bleeding every 2 weeks. Changing pad every 4 hours on heavy days. Pt states regular cycles, not just spotting. Had only spotting this month. Was last seen in Jan 2021 for PUS for same. Denies any heavy bleeding, soaking pads, clots, feeling lightheaded or dizzy.   Per reviewed notes, pt discussed POPs with Dr Sabra Heck. Pt now wanting to go on POPs to help regulate cycles better.  Pt advised to have OV . Pt agreeable. Pt scheduled for OV with Dr Sabra Heck on 10/21 at 330 pm. Pt verbalized understanding to date and time of appt.  Encounter closed.

## 2020-08-18 NOTE — Telephone Encounter (Signed)
Left message for pt to return call to triage RN. 

## 2020-08-18 NOTE — Telephone Encounter (Signed)
Patient is returning call.  °

## 2020-08-20 ENCOUNTER — Other Ambulatory Visit (HOSPITAL_COMMUNITY)
Admission: RE | Admit: 2020-08-20 | Discharge: 2020-08-20 | Disposition: A | Discharge status: Home / Self Care | Payer: BLUE CROSS/BLUE SHIELD | Source: Ambulatory Visit | Attending: Obstetrics & Gynecology | Admitting: Obstetrics & Gynecology

## 2020-08-20 ENCOUNTER — Other Ambulatory Visit: Payer: Self-pay

## 2020-08-20 ENCOUNTER — Encounter: Payer: Self-pay | Admitting: Obstetrics & Gynecology

## 2020-08-20 ENCOUNTER — Ambulatory Visit (INDEPENDENT_AMBULATORY_CARE_PROVIDER_SITE_OTHER): Payer: BLUE CROSS/BLUE SHIELD | Admitting: Obstetrics & Gynecology

## 2020-08-20 VITALS — BP 102/62 | HR 68 | Resp 16 | Wt 133.0 lb

## 2020-08-20 DIAGNOSIS — N926 Irregular menstruation, unspecified: Secondary | ICD-10-CM | POA: Insufficient documentation

## 2020-08-20 MED ORDER — NORETHINDRONE 0.35 MG PO TABS: 1.0000 | ORAL_TABLET | Freq: Every day | ORAL | 5 refills | Status: DC

## 2020-08-23 ENCOUNTER — Encounter: Payer: Self-pay | Admitting: Obstetrics & Gynecology

## 2020-08-24 ENCOUNTER — Telehealth: Payer: Self-pay

## 2020-08-24 ENCOUNTER — Encounter: Payer: Self-pay | Admitting: Obstetrics & Gynecology

## 2020-08-24 LAB — SURGICAL PATHOLOGY

## 2020-08-24 NOTE — Telephone Encounter (Signed)
Routing to Dr Sabra Heck for EMB results from 08/20/20

## 2020-08-24 NOTE — Telephone Encounter (Signed)
Pt sent following mychart message:  Carrie Higgins, Carrie Higgins Clinical Pool I see that my pathology report has posted to my chart but it doesn't make any sense to me. Can someone please email or call me with specifics? Thanks so much.

## 2020-08-24 NOTE — Telephone Encounter (Signed)
Spoke with patient regarding blood work from 08/20/20 as this was unable to be processed with [facility] that day. Patient is scheduled to return to office on 08/25/2020 at 11:15 am for recollection. Patient verbalizes understanding and is agreeable. Reviewed EMB results from report with patient. Advised will have Dr.Miller review the results and return call with additional information.

## 2020-08-25 ENCOUNTER — Other Ambulatory Visit: Payer: Self-pay

## 2020-08-25 ENCOUNTER — Other Ambulatory Visit (INDEPENDENT_AMBULATORY_CARE_PROVIDER_SITE_OTHER): Payer: BLUE CROSS/BLUE SHIELD

## 2020-08-25 DIAGNOSIS — N926 Irregular menstruation, unspecified: Secondary | ICD-10-CM

## 2020-08-25 LAB — FOLLICLE STIMULATING HORMONE

## 2020-08-25 NOTE — Progress Notes (Signed)
[place] reordered for recollection.

## 2020-08-25 NOTE — Progress Notes (Unsigned)
[place] reordered from 08/16/20

## 2020-08-26 ENCOUNTER — Telehealth: Payer: Self-pay | Admitting: *Deleted

## 2020-08-26 LAB — FOLLICLE STIMULATING HORMONE: FSH: 25.9 m[IU]/mL

## 2020-08-26 NOTE — Telephone Encounter (Signed)
Patient is returning call to Glorianne Manchester, RN.

## 2020-08-26 NOTE — Telephone Encounter (Signed)
Spoke with patient, advised per Dr. Miller. Patient verbalizes understanding and is agreeable. Encounter closed.  

## 2020-08-26 NOTE — Telephone Encounter (Signed)
Burnice Logan, RN  08/26/2020 3:20 PM EDT Back to Top    Left message to call Sharee Pimple, RN at [facility].

## 2020-08-26 NOTE — Telephone Encounter (Signed)
-----   Message from Megan Salon, MD sent at 08/26/2020 12:38 PM EDT ----- Please let pt know her [facility] is 25.  This is in premenopausal range.  It's reasonable to have irregular cycle with this value.  The endometrial biopsy did not show any abnormal cells.  We discussed starting treatment with micronor and rx has been sent.  She can start this at any time and should give an update after she's been on this two or three months.  Thanks.

## 2020-10-03 IMAGING — MG DIGITAL DIAGNOSTIC BILATERAL MAMMOGRAM WITH TOMO AND CAD
6 of 12 series · 6 of 36 positions shown · non-contrast
Comparison: 07/16/2018 and earlier

CLINICAL DATA: The patient recently noted palpable abnormalities in
each breast. A mass in the 12 o'clock location of the RIGHT breast
has gotten smaller over the last week. A mass in the retroareolar
region of the LEFT breast has gotten larger. The patient is very
aware of this LEFT breast lesion due to its location. The patient's
physician also noted a mass in the 9 o'clock location of the RIGHT
breast on physical exam.

EXAM:
DIGITAL DIAGNOSTIC BILATERAL MAMMOGRAM WITH CAD AND TOMO
ULTRASOUND BILATERAL BREAST

[R CC synth-2D]
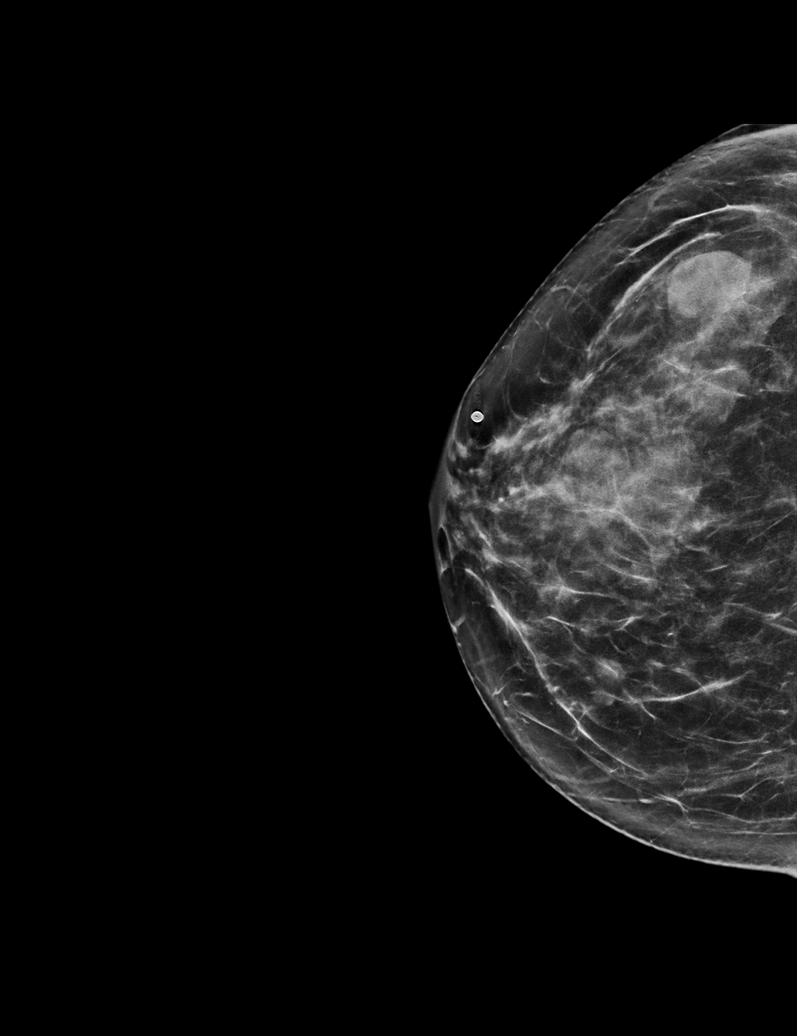

[L MLO synth-2D]
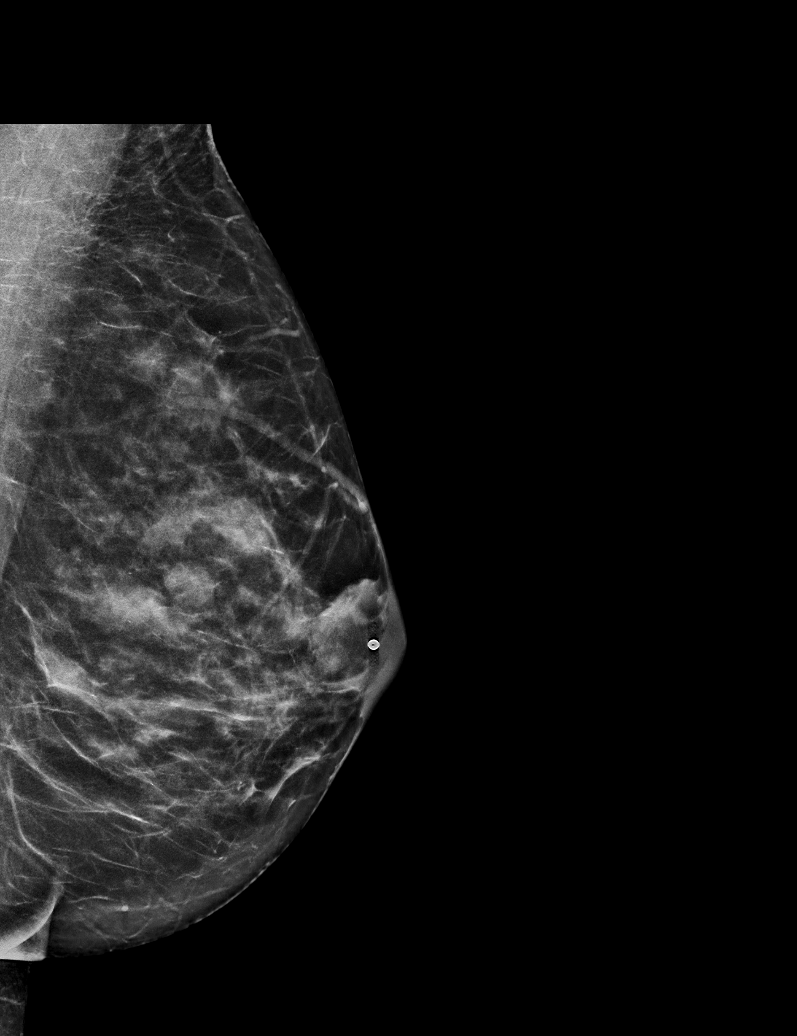

[R MLO synth-2D]
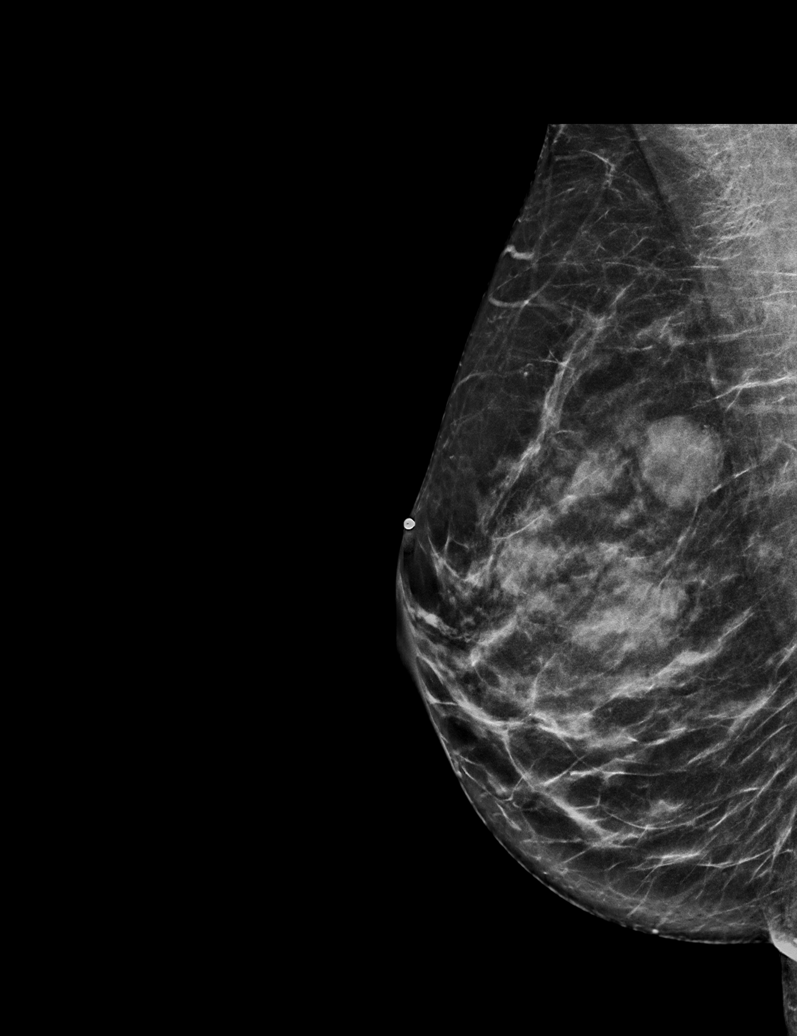

[R TAN synth-2D]
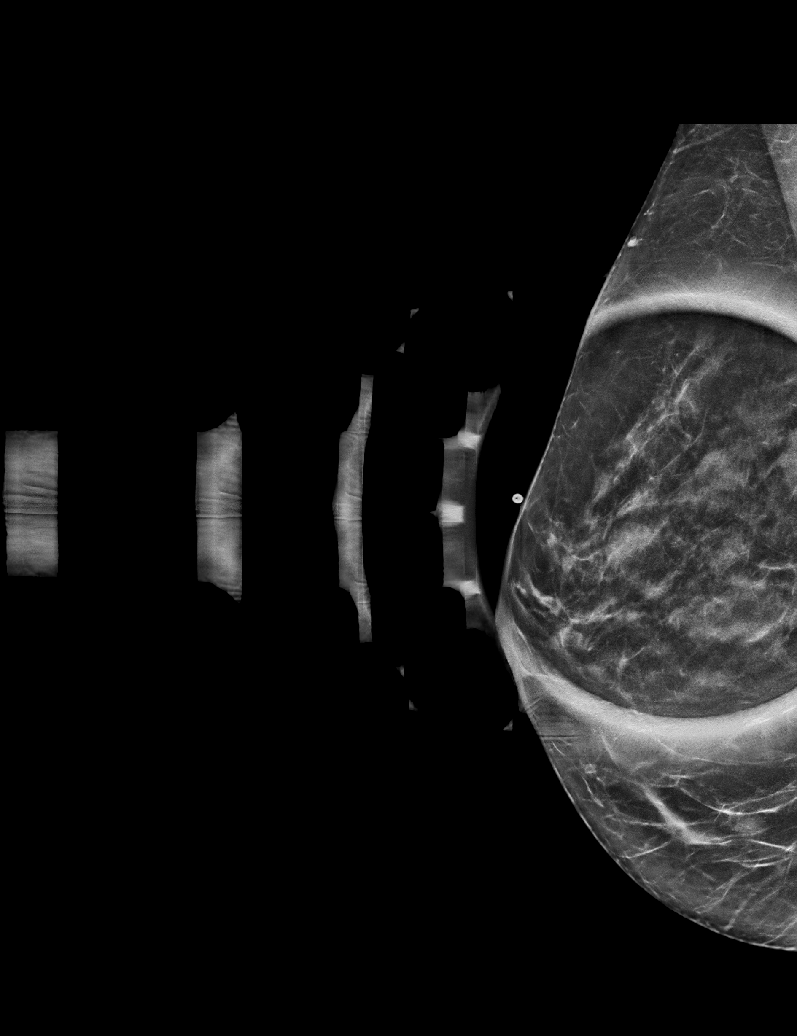

[L TAN synth-2D]
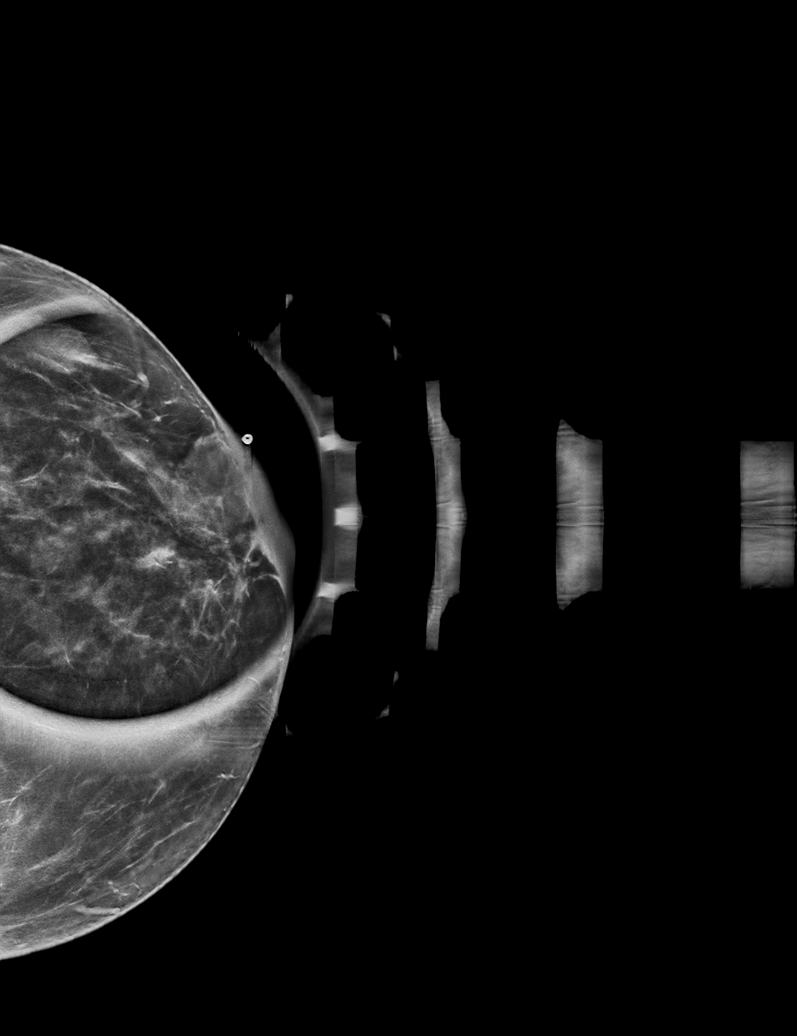

[L CC synth-2D]
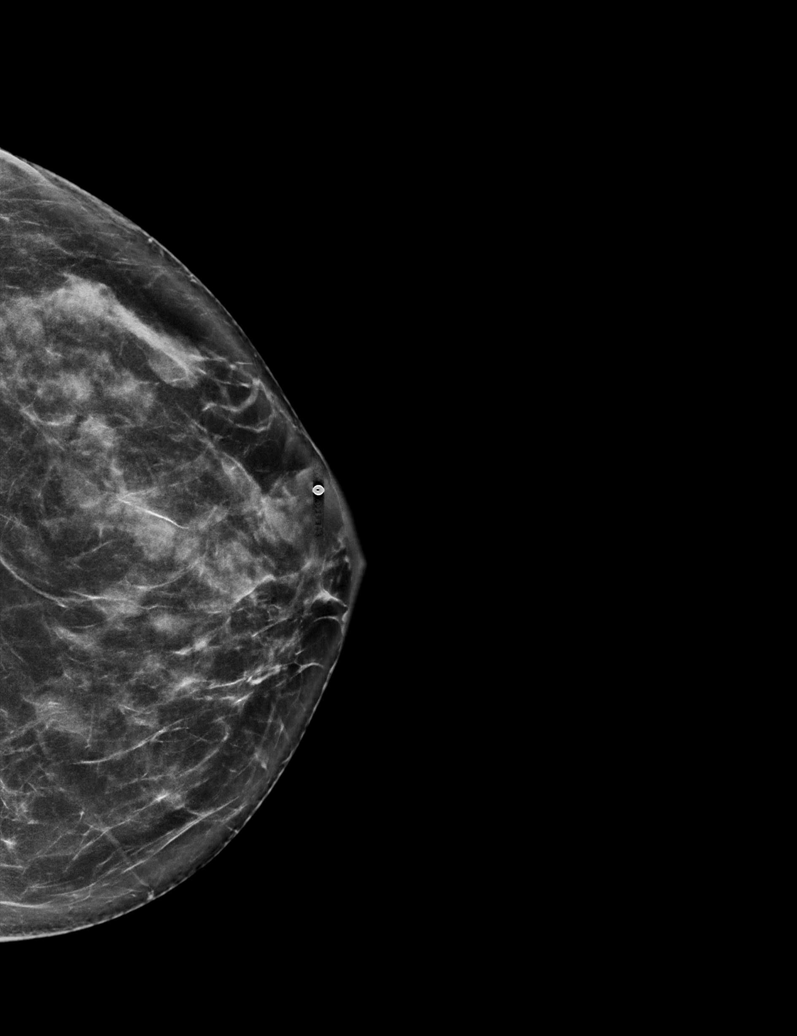

[6 of 36 positions shown; findings below may reference images not displayed]

ACR Breast Density Category c: The breast tissue is heterogeneously
dense, which may obscure small masses.
FINDINGS: Right breast:

Mammogram: Numerous circumscribed oval masses are identified within
the RIGHT breast, 1 of which is within the retroareolar region and
marked with BB. The a second similar lesion is identified in the
LATERAL portion of the RIGHT breast. Mammographic images were
processed with CAD.

Ultrasound: Targeted ultrasound is performed, showing a
circumscribed oval anechoic mass in the 12 o'clock location of the
RIGHT breast 1 centimeter from the nipple which measures 0.6 x 0.5 x
0.9 centimeters.

In the retroareolar region of the RIGHT breast a simple cyst is
x 0.8 x 1.1 centimeters.

In the 9 o'clock retroareolar location, a simple cyst is 1.6 x 1.4 x
1.7 centimeters.

In the 9 o'clock location of RIGHT breast 3 centimeters from the
nipple a simple cyst is 1.8 x 1.0 x 2.0 centimeters.

No solid mass or areas of acoustic shadowing identified.

Left breast:

Mammogram: Numerous circumscribed oval masses are identified within
the LEFT breast, largest of which is in the LATERAL retroareolar
region marked with BB as palpable. Mammographic images were
processed with CAD.

Physical Exam: There is a protuberant visible superficial mass just
LATERAL to the LEFT nipple. There is no associated erythema. Mass is
slightly tender on exam.

Ultrasound: Targeted ultrasound is performed, showing a simple cyst
in the 4 o'clock retroareolar region of the LEFT breast measuring
2.2 x 1.1 x 1.8 centimeters. There is dependent layering debris
within the cyst.
IMPRESSION: 1. Bilateral benign breast cysts.
2. No evidence for malignancy in either breast.
3. We discussed the option of cyst aspiration for symptomatic
relief. Patient requests aspiration of the lesion in the 4 o'clock
retroareolar region of the LEFT breast.

RECOMMENDATION:
1. Cyst aspiration LEFT breast. This has been scheduled for the
patient.
2. Recommend screening mammogram in 1 year.

I have discussed the findings and recommendations with the patient.
Results were also provided in writing at the conclusion of the
visit. If applicable, a reminder letter will be sent to the patient
regarding the next appointment.

BI-RADS CATEGORY  2: Benign.

## 2020-11-20 ENCOUNTER — Telehealth: Payer: Self-pay | Admitting: Obstetrics & Gynecology

## 2020-11-20 NOTE — Telephone Encounter (Signed)
Called pt.  Carrie Higgins has really helped.  Has only had one day of spotting since starting.  She's only had one menstrual cycle that lasted 3-4 days.  She is having some breast tenderness but this is mild.  MMG was normal 06/24/2020.

## 2020-12-01 DIAGNOSIS — U071 COVID-19: Secondary | ICD-10-CM

## 2020-12-01 HISTORY — DX: COVID-19: U07.1

## 2020-12-09 NOTE — Telephone Encounter (Signed)
Called patient and left message to call the office to schedule a sooner appt with Dr.Miller.

## 2020-12-09 NOTE — Telephone Encounter (Signed)
Can you call this pt and schedule her an appt?

## 2020-12-15 ENCOUNTER — Ambulatory Visit (INDEPENDENT_AMBULATORY_CARE_PROVIDER_SITE_OTHER): Payer: BC Managed Care – PPO | Admitting: Obstetrics & Gynecology

## 2020-12-15 ENCOUNTER — Encounter (HOSPITAL_BASED_OUTPATIENT_CLINIC_OR_DEPARTMENT_OTHER): Payer: Self-pay | Admitting: Obstetrics & Gynecology

## 2020-12-15 ENCOUNTER — Other Ambulatory Visit: Payer: Self-pay

## 2020-12-15 VITALS — BP 102/69 | HR 65 | Ht 62.0 in | Wt 131.0 lb

## 2020-12-15 DIAGNOSIS — N870 Mild cervical dysplasia: Secondary | ICD-10-CM

## 2020-12-15 DIAGNOSIS — N926 Irregular menstruation, unspecified: Secondary | ICD-10-CM | POA: Diagnosis not present

## 2020-12-15 NOTE — Progress Notes (Signed)
GYNECOLOGY  VISIT  CC:   Irregular bleeding  HPI: 50 y.o. G1P1 Married White or Caucasian female here for follow up of irregular bleeding.  This has been going on for about a year.  She did have an ultrasound in Jan, 2021.  She does have some small fibroids.  Uterus measured 8 x 6 x 4.4cm with 6.79mm endometrium that was avascular.   She did not really want to be on any medication initially.  She did have an endometrial biopsy on 08/20/2020.  Benign tissue with stromal breakdown noted.  Because the irregular bleeding continued, she started the POP in October.  Spotting became lighter for the first three months and then she skipped a cycle for about six weeks.  After that, she's had two episodes of bleeding like a cycle with heaviness and clotting.  These were separated by out 7 days.  She not missing any of the progesterone only pills.  She would like to consider other options.  Combination OCPs, higher dosed oral progesterone, progesterone IUD, depo provera, D&C, endometrial ablation, and hysterectomy all discussed.  I feel hysterectomy is too aggressive for treatment but felt we should at least discuss.  She agrees.  Risks and benefits, side effects of above discussed.  She would like to proceed with endometrial ablation.    Procedure discussed with patient.  Recovery and pain management discussed.  Risks discussed including but not limited to bleeding, rare risk of transfusion, infection, <1% risk of uterine perforation with risks of fluid deficit causing cardiac arrythmia, cerebral swelling and/or need to stop procedure early.  Fluid emboli and rare risk of death discussed.  DVT/PE, rare risk of risk of bowel/bladder/ureteral/vascular injury.  Patient aware if any endometrial lesions are present, I would resect at the same time and send for pathology.  Additional therapy may be needed if pathology was abnormal.  All questions answered.    Last pap:  08/07/2018.  Neg with neg HR HPV  Patient Active  Problem List   Diagnosis Date Noted  . Dysplasia of cervix, low grade (CIN 1) 06/14/2016  . ASCUS with positive high risk HPV 03/10/2015  . Breast cyst 12/16/2013  . Hematuria 10/03/2011  . Allergic rhinitis 07/19/2011  . Anxiety 07/19/2011    Past Medical History:  Diagnosis Date  . Abnormal pap 2006   2006 ASCUS - HPV, 02-11-14 ASCUS +HPV  . Basal cell carcinoma    Dr. Wilhemina Bonito  . Environmental allergies   . Osteopenia    BMD age 65    No past surgical history on file.  MEDS:   Current Outpatient Medications on File Prior to Visit  Medication Sig Dispense Refill  . Azelastine-Fluticasone 137-50 MCG/ACT SUSP Place 1 spray into the nose daily.    Marland Kitchen FLUoxetine (PROZAC) 20 MG capsule TAKE ONE CAPSULE (20 MG DOSE) BY MOUTH DAILY.    . fluticasone (FLONASE) 50 MCG/ACT nasal spray Place into both nostrils.    Marland Kitchen levocetirizine (XYZAL) 5 MG tablet Take 5 mg by mouth every evening.    Marland Kitchen LORazepam (ATIVAN) 0.5 MG tablet Take 0.5 mg by mouth as needed for anxiety.    . norethindrone (MICRONOR) 0.35 MG tablet Take 1 tablet (0.35 mg total) by mouth daily. 28 tablet 5  . olopatadine (PATANOL) 0.1 % ophthalmic solution Place 1 drop into both eyes daily as needed. Reported on 05/16/2016     Current Facility-Administered Medications on File Prior to Visit  Medication Dose Route Frequency Provider Last Rate Last Admin  .  0.9 %  sodium chloride infusion  500 mL Intravenous Once Pyrtle, Lajuan Lines, MD        ALLERGIES: Levaquin [levofloxacin], Augmentin [amoxicillin-pot clavulanate], and Neosporin [neomycin-bacitracin zn-polymyx]  Family History  Problem Relation Age of Onset  . Heart disease Maternal Grandmother   . Diabetes Maternal Grandfather   . Heart disease Maternal Grandfather   . Heart disease Paternal Grandfather   . Atrial fibrillation Mother   . Colon polyps Father        Precancerous    SH:  Married, non smoker  Review of Systems  Constitutional: Negative.   Respiratory:  Negative.   Cardiovascular: Negative.   Gastrointestinal: Negative.   Genitourinary: Positive for menstrual problem.  Psychiatric/Behavioral: The patient is not nervous/anxious.     PHYSICAL EXAMINATION:    BP 102/69   Pulse 65   Ht 5\' 2"  (1.575 m)   Wt 131 lb (59.4 kg)   SpO2 99%   BMI 23.96 kg/m     General appearance: alert, cooperative and appears stated age CV:  Regular rate and rhythm Lungs:  clear to auscultation, no wheezes, rales or rhonchi, symmetric air entry Lymph:  no inguinal LAD noted  Pelvic: External genitalia:  no lesions              Urethra:  normal appearing urethra with no masses, tenderness or lesions              Bartholins and Skenes: normal                 Vagina: normal appearing vagina with normal color and discharge, no lesions              Cervix: no lesions              Bimanual Exam:  Uterus:  normal size, contour, position, consistency, mobility, non-tender              Adnexa: no mass, fullness, tenderness  Chaperone, Britt Bottom, CMA, was present for exam.  Assessment/Plan: 1. Irregular bleeding - will plan to proceed with hysteroscopy with endometrial ablation in OR.  Pt does have upcoming trips so will need to arrange around this.    2. Dysplasia of cervix, low grade (CIN 1) - pap with HR HPV due 07/2021   35 minutes of total time was spent for this patient encounter, including preparation, face-to-face counseling with the patient and coordination of care, and documentation of the encounter.

## 2020-12-18 ENCOUNTER — Ambulatory Visit (AMBULATORY_SURGERY_CENTER): Payer: BC Managed Care – PPO | Admitting: *Deleted

## 2020-12-18 ENCOUNTER — Other Ambulatory Visit: Payer: Self-pay

## 2020-12-18 VITALS — Ht 62.0 in | Wt 128.0 lb

## 2020-12-18 DIAGNOSIS — Z86010 Personal history of colonic polyps: Secondary | ICD-10-CM

## 2020-12-18 MED ORDER — PLENVU 140 G PO SOLR: 1.0000 | ORAL | 0 refills | Status: DC

## 2020-12-18 NOTE — Progress Notes (Signed)
No egg or soy allergy known to patient  No issues with past sedation with any surgeries or procedures No intubation problems in the past  No FH of Malignant Hyperthermia No diet pills per patient No home 02 use per patient  No blood thinners per patient  Pt denies issues with constipation  No A fib or A flutter  EMMI video to pt or via [place] 19 guidelines implemented in PV today with Pt and RN  Pt is fully vaccinated  for Covid   Plenvu Coupon given to pt in PV today , Code to Pharmacy and  NO PA's for preps discussed with pt In PV today  Discussed with pt there will be an out-of-pocket cost for prep and that varies from $0 to 70 dollars - pt verbalized understanding   Due to the COVID-19 pandemic we are asking patients to follow certain guidelines.  Pt aware of COVID protocols and [facility] guidelines   Pt verified name, DOB, address and insurance during PV today. Pt mailed instruction packet to included paper to complete and mail back to [facility] with addressed and stamped envelope, Emmi video, copy of consent form to read and not return, and instructions. Plenvu  coupon mailed in packet. PV completed over the phone. Pt encouraged to call with questions or issues

## 2020-12-20 ENCOUNTER — Encounter (HOSPITAL_BASED_OUTPATIENT_CLINIC_OR_DEPARTMENT_OTHER): Payer: Self-pay

## 2020-12-25 ENCOUNTER — Encounter: Payer: Self-pay | Admitting: Internal Medicine

## 2020-12-30 ENCOUNTER — Telehealth: Payer: Self-pay | Admitting: Internal Medicine

## 2020-12-30 DIAGNOSIS — Z8616 Personal history of COVID-19: Secondary | ICD-10-CM

## 2020-12-30 HISTORY — DX: Personal history of COVID-19: Z86.16

## 2020-12-30 NOTE — Telephone Encounter (Signed)
Hi Dr. Hilarie Fredrickson., this patient just called to cancel procedure that was scheduled on Friday 01/01/21 because she tested positive for Covid today. However, she is feeling fine and just has a scratchy throat. Patient has rescheduled to 01/22/21 at 4:00pm.Thank you.

## 2021-01-01 ENCOUNTER — Encounter: Payer: BLUE CROSS/BLUE SHIELD | Admitting: Internal Medicine

## 2021-01-04 ENCOUNTER — Encounter (HOSPITAL_BASED_OUTPATIENT_CLINIC_OR_DEPARTMENT_OTHER): Payer: Self-pay

## 2021-01-05 ENCOUNTER — Other Ambulatory Visit (HOSPITAL_BASED_OUTPATIENT_CLINIC_OR_DEPARTMENT_OTHER): Payer: Self-pay | Admitting: Obstetrics & Gynecology

## 2021-01-05 MED ORDER — NORETHINDRONE 0.35 MG PO TABS
1.0000 | ORAL_TABLET | Freq: Every day | ORAL | 5 refills | Status: DC
Start: 2021-01-05 — End: 2021-02-17

## 2021-01-07 ENCOUNTER — Ambulatory Visit (HOSPITAL_BASED_OUTPATIENT_CLINIC_OR_DEPARTMENT_OTHER): Payer: BLUE CROSS/BLUE SHIELD | Admitting: Obstetrics & Gynecology

## 2021-01-08 ENCOUNTER — Encounter (HOSPITAL_BASED_OUTPATIENT_CLINIC_OR_DEPARTMENT_OTHER): Payer: Self-pay | Admitting: *Deleted

## 2021-01-09 ENCOUNTER — Other Ambulatory Visit (HOSPITAL_BASED_OUTPATIENT_CLINIC_OR_DEPARTMENT_OTHER): Payer: Self-pay | Admitting: Obstetrics & Gynecology

## 2021-01-22 ENCOUNTER — Ambulatory Visit (AMBULATORY_SURGERY_CENTER): Payer: BC Managed Care – PPO | Admitting: Internal Medicine

## 2021-01-22 ENCOUNTER — Other Ambulatory Visit: Payer: Self-pay

## 2021-01-22 ENCOUNTER — Encounter: Payer: Self-pay | Admitting: Internal Medicine

## 2021-01-22 VITALS — BP 107/50 | HR 62 | Temp 96.6°F | Resp 9 | Ht 62.0 in | Wt 128.0 lb

## 2021-01-22 DIAGNOSIS — D123 Benign neoplasm of transverse colon: Secondary | ICD-10-CM

## 2021-01-22 DIAGNOSIS — Z860100 Personal history of colon polyps, unspecified: Secondary | ICD-10-CM

## 2021-01-22 DIAGNOSIS — K635 Polyp of colon: Secondary | ICD-10-CM | POA: Diagnosis not present

## 2021-01-22 MED ORDER — SODIUM CHLORIDE 0.9 % IV SOLN: 500.0000 mL | Freq: Once | INTRAVENOUS | Status: DC

## 2021-01-22 NOTE — Progress Notes (Signed)
PT taken to PACU. Monitors in place. VSS. Report given to RN. 

## 2021-01-22 NOTE — Progress Notes (Signed)
[facility]  Pt's states no medical or surgical changes since previsit or office visit.  

## 2021-01-22 NOTE — Progress Notes (Signed)
Called to room to assist during endoscopic procedure.  Patient ID and intended procedure confirmed with present staff. Received instructions for my participation in the procedure from the performing physician.  

## 2021-01-22 NOTE — Patient Instructions (Signed)
Handout given:  Polyps Resume previous diet Continue current medications Await pathology results  YOU HAD AN ENDOSCOPIC PROCEDURE TODAY AT THE [facility]:   Refer to the procedure report that was given to you for any specific questions about what was found during the examination.  If the procedure report does not answer your questions, please call your gastroenterologist to clarify.  If you requested that your care partner not be given the details of your procedure findings, then the procedure report has been included in a sealed envelope for you to review at your convenience later.  YOU SHOULD EXPECT: Some feelings of bloating in the abdomen. Passage of more gas than usual.  Walking can help get rid of the air that was put into your GI tract during the procedure and reduce the bloating. If you had a lower endoscopy (such as a colonoscopy or flexible sigmoidoscopy) you may notice spotting of blood in your stool or on the toilet paper. If you underwent a bowel prep for your procedure, you may not have a normal bowel movement for a few days.  Please Note:  You might notice some irritation and congestion in your nose or some drainage.  This is from the oxygen used during your procedure.  There is no need for concern and it should clear up in a day or so.  SYMPTOMS TO REPORT IMMEDIATELY:   Following lower endoscopy (colonoscopy or flexible sigmoidoscopy):  Excessive amounts of blood in the stool  Significant tenderness or worsening of abdominal pains  Swelling of the abdomen that is new, acute  Fever of 100F or higher  For urgent or emergent issues, a gastroenterologist can be reached at any hour by calling [phone]. Do not use MyChart messaging for urgent concerns.    DIET:  We do recommend a small meal at first, but then you may proceed to your regular diet.  Drink plenty of fluids but you should avoid alcoholic beverages for 24 hours.  ACTIVITY:  You should plan to take  it easy for the rest of today and you should NOT DRIVE or use heavy machinery until tomorrow (because of the sedation medicines used during the test).    FOLLOW UP: Our staff will call the number listed on your records 48-72 hours following your procedure to check on you and address any questions or concerns that you may have regarding the information given to you following your procedure. If we do not reach you, we will leave a message.  We will attempt to reach you two times.  During this call, we will ask if you have developed any symptoms of COVID 19. If you develop any symptoms (ie: fever, flu-like symptoms, shortness of breath, cough etc.) before then, please call [phone].  If you test positive for Covid 19 in the 2 weeks post procedure, please call and report this information to us.    If any biopsies were taken you will be contacted by phone or by letter within the next 1-3 weeks.  Please call us at [phone] if you have not heard about the biopsies in 3 weeks.   SIGNATURES/CONFIDENTIALITY: You and/or your care partner have signed paperwork which will be entered into your electronic medical record.  These signatures attest to the fact that that the information above on your After Visit Summary has been reviewed and is understood.  Full responsibility of the confidentiality of this discharge information lies with you and/or your care-partner. 

## 2021-01-22 NOTE — Op Note (Signed)
[place] Patient Name: Kade Rickels Procedure Date: 01/22/2021 11:26 AM MRN: [id number] Endoscopist: Jerene Bears , MD Age: 50 Referring MD:  Date of Birth: 1971-05-27 Gender: Female Account #: [id number] Procedure:                Colonoscopy Indications:              High risk colon cancer surveillance: Personal                            history of sessile serrated colon polyps (less than                            10 mm in size) with no dysplasia x 2 and tubular                            adenoma x 1 at last colonoscopy in Jan 2019; family                            history of colon polyps Medicines:                Monitored Anesthesia Care Procedure:                Pre-Anesthesia Assessment:                           - Prior to the procedure, a History and Physical                            was performed, and patient medications and                            allergies were reviewed. The patient's tolerance of                            previous anesthesia was also reviewed. The risks                            and benefits of the procedure and the sedation                            options and risks were discussed with the patient.                            All questions were answered, and informed consent                            was obtained. Prior Anticoagulants: The patient has                            taken no previous anticoagulant or antiplatelet                            agents. ASA Grade Assessment: II - A patient with  mild systemic disease. After reviewing the risks                            and benefits, the patient was deemed in                            satisfactory condition to undergo the procedure.                           After obtaining informed consent, the colonoscope                            was passed under direct vision. Throughout the                            procedure, the patient's blood  pressure, pulse, and                            oxygen saturations were monitored continuously. The                            Olympus PFC-H190DL (#[id number]) Colonoscope was                            introduced through the anus and advanced to the                            cecum, identified by appendiceal orifice and                            ileocecal valve. The colonoscopy was performed                            without difficulty. The patient tolerated the                            procedure well. The quality of the bowel                            preparation was excellent. The ileocecal valve,                            appendiceal orifice, and rectum were photographed. Scope In: 11:34:22 AM Scope Out: 11:50:33 AM Scope Withdrawal Time: 0 hours 12 minutes 7 seconds  Total Procedure Duration: 0 hours 16 minutes 11 seconds  Findings:                 The digital rectal exam was normal.                           A 6 mm polyp was found in the distal transverse                            colon. The polyp was sessile. The polyp was removed  with a cold snare. Resection and retrieval were                            complete.                           The exam was otherwise without abnormality on                            direct and retroflexion views. Complications:            No immediate complications. Estimated Blood Loss:     Estimated blood loss was minimal. Impression:               - One 6 mm polyp in the distal transverse colon,                            removed with a cold snare. Resected and retrieved.                           - The examination was otherwise normal on direct                            and retroflexion views. Recommendation:           - Patient has a contact number available for                            emergencies. The signs and symptoms of potential                            delayed complications were discussed with the                             patient. Return to normal activities tomorrow.                            Written discharge instructions were provided to the                            patient.                           - Resume previous diet.                           - Continue present medications.                           - Await pathology results.                           - Repeat colonoscopy in 5 years for surveillance. Jerene Bears, MD 01/22/2021 11:52:51 AM This report has been signed electronically.

## 2021-01-26 ENCOUNTER — Telehealth: Payer: Self-pay | Admitting: *Deleted

## 2021-01-26 NOTE — Telephone Encounter (Signed)
  Follow up Call-  Call back number 01/22/2021  Post procedure Call Back phone  # [phone]  Permission to leave phone message Yes  Some recent data might be hidden     Patient questions:  Do you have a fever, pain , or abdominal swelling? No. Pain Score  0 *  Have you tolerated food without any problems? Yes.    Have you been able to return to your normal activities? Yes.    Do you have any questions about your discharge instructions: Diet   No. Medications  No. Follow up visit  No.  Do you have questions or concerns about your Care? No.  Actions: * If pain score is 4 or above: No action needed, pain <4

## 2021-01-28 ENCOUNTER — Other Ambulatory Visit (HOSPITAL_BASED_OUTPATIENT_CLINIC_OR_DEPARTMENT_OTHER): Payer: Self-pay | Admitting: Obstetrics & Gynecology

## 2021-01-29 ENCOUNTER — Ambulatory Visit: Payer: Self-pay

## 2021-02-04 ENCOUNTER — Telehealth: Payer: Self-pay | Admitting: *Deleted

## 2021-02-04 ENCOUNTER — Encounter: Payer: Self-pay | Admitting: Internal Medicine

## 2021-02-04 ENCOUNTER — Other Ambulatory Visit: Payer: Self-pay

## 2021-02-04 ENCOUNTER — Ambulatory Visit (INDEPENDENT_AMBULATORY_CARE_PROVIDER_SITE_OTHER): Payer: BC Managed Care – PPO | Admitting: Obstetrics & Gynecology

## 2021-02-04 ENCOUNTER — Encounter (HOSPITAL_BASED_OUTPATIENT_CLINIC_OR_DEPARTMENT_OTHER): Payer: Self-pay | Admitting: *Deleted

## 2021-02-04 ENCOUNTER — Encounter (HOSPITAL_BASED_OUTPATIENT_CLINIC_OR_DEPARTMENT_OTHER): Payer: Self-pay | Admitting: Obstetrics & Gynecology

## 2021-02-04 VITALS — BP 106/70 | HR 66

## 2021-02-04 DIAGNOSIS — N921 Excessive and frequent menstruation with irregular cycle: Secondary | ICD-10-CM

## 2021-02-04 DIAGNOSIS — D251 Intramural leiomyoma of uterus: Secondary | ICD-10-CM

## 2021-02-04 NOTE — Telephone Encounter (Signed)
Call to patient and advised surgery on 02-17-21 has been moved to [facility].   Encounter closed.

## 2021-02-04 NOTE — Progress Notes (Signed)
50 y.o. G1P1 Married White or Caucasian female here for discussion of upcoming procedure.  Hysteroscopy with endometrial ablation planned due to menorrhagia.  Pre-op evaluation thus far has included endometrial biopsy on 08/20/20.  Last ultrasound was 11/21/19 showing uterus measuring 8.1 x 5.9 x 4.4cm with four small fibroids measuring 0.9cm, 1.3cm, 1.1cm and 1.7cm.  She has been on POPs without significant improvement.  She is not interested in an IUD or estrogen containing pills.  She is desirous of treatment that is non hormonal  Procedure discussed with patient.  Recovery and pain management discussed.  Risks discussed including but not limited to bleeding, rare risk of transfusion, infection, 1% risk of uterine perforation with risks of fluid deficit causing cardiac arrythmia, cerebral swelling and/or need to stop procedure early.  Fluid emboli and rare risk of death discussed.  DVT/PE, rare risk of risk of bowel/bladder/ureteral/vascular injury.  Patient aware if pathology abnormal she may need additional treatment.  Pt also understands she should not attempt pregnancy in the future (which made her laugh).  All questions answered.    Ob Hx:           Sexually active: Yes.   Birth control: vasectomy Last pap: 08/17/18 neg with neg HR HPV Last MMG: 06/24/2020 Smoking: No   Past Surgical History:  Procedure Laterality Date  . COLONOSCOPY    . POLYPECTOMY      Past Medical History:  Diagnosis Date  . Abnormal pap 2006   2006 ASCUS - HPV, 02-11-14 ASCUS +HPV  . Allergy   . Anxiety    situational with air travel   . Basal cell carcinoma    Dr. Wilhemina Bonito  . COVID-19 12/2020  . Environmental allergies   . Osteopenia    BMD age 52    Allergies: Levaquin [levofloxacin], Augmentin [amoxicillin-pot clavulanate], and Neosporin [neomycin-bacitracin zn-polymyx]  Current Outpatient Medications  Medication Sig Dispense Refill  . APPLE CIDER VINEGAR PO Take by mouth. Gummies    .  Azelastine-Fluticasone 137-50 MCG/ACT SUSP Place 1 spray into the nose daily. (Patient not taking: No sig reported)    . FLUoxetine (PROZAC) 20 MG capsule TAKE ONE CAPSULE (20 MG DOSE) BY MOUTH DAILY.    . fluticasone (FLONASE) 50 MCG/ACT nasal spray Place into both nostrils. (Patient not taking: No sig reported)    . levocetirizine (XYZAL) 5 MG tablet Take 5 mg by mouth every evening.    Marland Kitchen LORazepam (ATIVAN) 0.5 MG tablet Take 0.5 mg by mouth as needed for anxiety.    . norethindrone (MICRONOR) 0.35 MG tablet Take 1 tablet (0.35 mg total) by mouth daily. 28 tablet 5  . Nutritional Supplements (JUICE PLUS FIBRE PO) Take by mouth.    Marland Kitchen olopatadine (PATANOL) 0.1 % ophthalmic solution Place 1 drop into both eyes daily as needed. Reported on 05/16/2016     No current facility-administered medications for this visit.    ROS: Pertinent items noted in HPI and remainder of comprehensive ROS otherwise negative.  Exam:   BP 106/70   Pulse 66   SpO2 100%   General appearance: alert and cooperative Head: Normocephalic, without obvious abnormality, atraumatic Neck: no adenopathy, supple, symmetrical, trachea midline and thyroid not enlarged, symmetric, no tenderness/mass/nodules Lungs: clear to auscultation bilaterally Heart: regular rate and rhythm, S1, S2 normal, no murmur, click, rub or gallop Abdomen: soft, non-tender; bowel sounds normal; no masses,  no organomegaly Extremities: extremities normal, atraumatic, no cyanosis or edema Skin: Skin color, texture, turgor normal. No rashes or  lesions Lymph nodes: Cervical, supraclavicular, and axillary nodes normal. no inguinal nodes palpated Neurologic: Grossly normal  Assessment/Plan: 1. Menorrhagia with irregular cycle - hysteroscopy with endometrial ablation planned - Pre and post op instructions reviewed - Post op pain medications reviewed - Medications/Vitamins reviewed.  2. Intramural uterine fibroid

## 2021-02-07 DIAGNOSIS — D251 Intramural leiomyoma of uterus: Secondary | ICD-10-CM | POA: Insufficient documentation

## 2021-02-07 DIAGNOSIS — N921 Excessive and frequent menstruation with irregular cycle: Secondary | ICD-10-CM | POA: Insufficient documentation

## 2021-02-09 ENCOUNTER — Encounter (HOSPITAL_BASED_OUTPATIENT_CLINIC_OR_DEPARTMENT_OTHER): Payer: Self-pay | Admitting: Obstetrics & Gynecology

## 2021-02-10 ENCOUNTER — Encounter (HOSPITAL_BASED_OUTPATIENT_CLINIC_OR_DEPARTMENT_OTHER): Payer: Self-pay | Admitting: Obstetrics & Gynecology

## 2021-02-10 ENCOUNTER — Other Ambulatory Visit: Payer: Self-pay

## 2021-02-10 NOTE — Progress Notes (Signed)
Spoke w/ via phone for pre-op interview--- PT Lab needs dos---CBC, Urine preg, T&S             Lab results------ no COVID test ------ pt positive covid 12-30-2020, result in care everwhere, no test needed since within 90 day window per policy.  Pt stated had  Very mild symptoms that resolved in few days Arrive at -------  0815 on 02-17-2021 NPO after MN NO Solid Food.  Clear liquids from MN until--- 0815 Med rec completed Medications to take morning of surgery ----- NONE Diabetic medication ----- n/a Patient instructed to bring photo id and insurance card day of surgery Patient aware to have Driver (ride ) / caregiver    for 24 hours after surgery -- husband, Carrie Higgins Patient Special Instructions ----- n/a Pre-Op special Istructions ----- n/a Patient verbalized understanding of instructions that were given at this phone interview. Patient denies shortness of breath, chest pain, fever, cough at this phone interview.

## 2021-02-17 ENCOUNTER — Ambulatory Visit (HOSPITAL_BASED_OUTPATIENT_CLINIC_OR_DEPARTMENT_OTHER): Payer: BC Managed Care – PPO | Admitting: Anesthesiology

## 2021-02-17 ENCOUNTER — Encounter (HOSPITAL_BASED_OUTPATIENT_CLINIC_OR_DEPARTMENT_OTHER): Payer: Self-pay | Admitting: Obstetrics & Gynecology

## 2021-02-17 ENCOUNTER — Ambulatory Visit (HOSPITAL_BASED_OUTPATIENT_CLINIC_OR_DEPARTMENT_OTHER)
Admission: RE | Admit: 2021-02-17 | Discharge: 2021-02-17 | Disposition: A | Discharge status: Home / Self Care | Payer: BC Managed Care – PPO | Attending: Obstetrics & Gynecology | Admitting: Obstetrics & Gynecology

## 2021-02-17 ENCOUNTER — Other Ambulatory Visit: Payer: Self-pay

## 2021-02-17 ENCOUNTER — Encounter (HOSPITAL_BASED_OUTPATIENT_CLINIC_OR_DEPARTMENT_OTHER)
Admission: RE | Disposition: A | Discharge status: Home / Self Care | Payer: Self-pay | Source: Home / Self Care | Attending: Obstetrics & Gynecology

## 2021-02-17 DIAGNOSIS — Z793 Long term (current) use of hormonal contraceptives: Secondary | ICD-10-CM | POA: Diagnosis not present

## 2021-02-17 DIAGNOSIS — N920 Excessive and frequent menstruation with regular cycle: Secondary | ICD-10-CM | POA: Diagnosis not present

## 2021-02-17 DIAGNOSIS — F419 Anxiety disorder, unspecified: Secondary | ICD-10-CM | POA: Diagnosis not present

## 2021-02-17 DIAGNOSIS — Z881 Allergy status to other antibiotic agents status: Secondary | ICD-10-CM | POA: Diagnosis not present

## 2021-02-17 DIAGNOSIS — Z880 Allergy status to penicillin: Secondary | ICD-10-CM | POA: Insufficient documentation

## 2021-02-17 DIAGNOSIS — Z8616 Personal history of COVID-19: Secondary | ICD-10-CM | POA: Diagnosis not present

## 2021-02-17 DIAGNOSIS — N921 Excessive and frequent menstruation with irregular cycle: Secondary | ICD-10-CM | POA: Diagnosis not present

## 2021-02-17 DIAGNOSIS — N841 Polyp of cervix uteri: Secondary | ICD-10-CM

## 2021-02-17 HISTORY — DX: Excessive and frequent menstruation with irregular cycle: N92.1

## 2021-02-17 HISTORY — DX: Other allergic rhinitis: J30.89

## 2021-02-17 HISTORY — DX: Leiomyoma of uterus, unspecified: D25.9

## 2021-02-17 HISTORY — DX: Family history of other specified conditions: Z84.89

## 2021-02-17 HISTORY — PX: HYSTEROSCOPY WITH NOVASURE: SHX5574

## 2021-02-17 HISTORY — DX: Personal history of other malignant neoplasm of skin: Z85.828

## 2021-02-17 LAB — CBC
HCT: 43.6 % (ref 36.0–46.0)
Hemoglobin: 14.4 g/dL (ref 12.0–15.0)
MCH: 30.6 pg (ref 26.0–34.0)
MCHC: 33 g/dL (ref 30.0–36.0)
MCV: 92.6 fL (ref 80.0–100.0)
Platelets: 189 10*3/uL (ref 150–400)
RBC: 4.71 MIL/uL (ref 3.87–5.11)
RDW: 12.7 % (ref 11.5–15.5)
WBC: 5.3 10*3/uL (ref 4.0–10.5)
nRBC: 0 % (ref 0.0–0.2)

## 2021-02-17 LAB — POCT PREGNANCY, URINE: Preg Test, Ur: NEGATIVE

## 2021-02-17 SURGERY — HYSTEROSCOPY WITH NOVASURE
Anesthesia: General | Site: Vagina

## 2021-02-17 MED ORDER — LIDOCAINE 2% (20 MG/ML) 5 ML SYRINGE
INTRAMUSCULAR | Status: DC | PRN
  Administered 2021-02-17: 40 mg via INTRAVENOUS

## 2021-02-17 MED ORDER — ONDANSETRON HCL 4 MG/2ML IJ SOLN: 4.0000 mg | Freq: Once | INTRAMUSCULAR | Status: DC | PRN

## 2021-02-17 MED ORDER — PROPOFOL 10 MG/ML IV BOLUS
INTRAVENOUS | Status: DC | PRN
  Administered 2021-02-17: 120 mg via INTRAVENOUS

## 2021-02-17 MED ORDER — LIDOCAINE 2% (20 MG/ML) 5 ML SYRINGE
INTRAMUSCULAR | Status: AC
  Filled 2021-02-17: qty 5

## 2021-02-17 MED ORDER — PROPOFOL 10 MG/ML IV BOLUS
INTRAVENOUS | Status: AC
  Filled 2021-02-17: qty 20

## 2021-02-17 MED ORDER — ONDANSETRON HCL 4 MG/2ML IJ SOLN
INTRAMUSCULAR | Status: AC
  Filled 2021-02-17: qty 2

## 2021-02-17 MED ORDER — POVIDONE-IODINE 10 % EX SWAB: 2.0000 "application " | Freq: Once | CUTANEOUS | Status: DC

## 2021-02-17 MED ORDER — ONDANSETRON HCL 4 MG/2ML IJ SOLN
INTRAMUSCULAR | Status: DC | PRN
  Administered 2021-02-17: 4 mg via INTRAVENOUS

## 2021-02-17 MED ORDER — KETOROLAC TROMETHAMINE 30 MG/ML IJ SOLN
INTRAMUSCULAR | Status: AC
  Filled 2021-02-17: qty 1

## 2021-02-17 MED ORDER — OXYCODONE HCL 5 MG/5ML PO SOLN: 5.0000 mg | Freq: Once | ORAL | Status: DC | PRN

## 2021-02-17 MED ORDER — ACETAMINOPHEN 160 MG/5ML PO SOLN
325.0000 mg | ORAL | Status: DC | PRN
Start: 2021-02-17 — End: 2021-02-17

## 2021-02-17 MED ORDER — SILVER NITRATE-POT NITRATE 75-25 % EX MISC
CUTANEOUS | Status: DC | PRN
  Administered 2021-02-17: 2

## 2021-02-17 MED ORDER — EPHEDRINE SULFATE 50 MG/ML IJ SOLN
INTRAMUSCULAR | Status: DC | PRN
  Administered 2021-02-17 (×2): 20 mg via INTRAVENOUS

## 2021-02-17 MED ORDER — ACETAMINOPHEN 500 MG PO TABS
1000.0000 mg | ORAL_TABLET | ORAL | Status: AC
  Administered 2021-02-17: 1000 mg via ORAL

## 2021-02-17 MED ORDER — EPHEDRINE 5 MG/ML INJ
INTRAVENOUS | Status: AC
  Filled 2021-02-17: qty 10

## 2021-02-17 MED ORDER — MIDAZOLAM HCL 2 MG/2ML IJ SOLN
INTRAMUSCULAR | Status: AC
  Filled 2021-02-17: qty 2

## 2021-02-17 MED ORDER — MIDAZOLAM HCL 5 MG/5ML IJ SOLN
INTRAMUSCULAR | Status: DC | PRN
  Administered 2021-02-17: 2 mg via INTRAVENOUS

## 2021-02-17 MED ORDER — LIDOCAINE-EPINEPHRINE 1 %-1:100000 IJ SOLN
INTRAMUSCULAR | Status: DC | PRN
  Administered 2021-02-17: 10 mL

## 2021-02-17 MED ORDER — MEPERIDINE HCL 25 MG/ML IJ SOLN: 6.2500 mg | INTRAMUSCULAR | Status: DC | PRN

## 2021-02-17 MED ORDER — DEXAMETHASONE SODIUM PHOSPHATE 4 MG/ML IJ SOLN
INTRAMUSCULAR | Status: DC | PRN
  Administered 2021-02-17: 5 mg via INTRAVENOUS

## 2021-02-17 MED ORDER — HYDROCODONE-ACETAMINOPHEN 5-325 MG PO TABS: 1.0000 | ORAL_TABLET | Freq: Four times a day (QID) | ORAL | 0 refills | Status: DC | PRN

## 2021-02-17 MED ORDER — FENTANYL CITRATE (PF) 100 MCG/2ML IJ SOLN
INTRAMUSCULAR | Status: AC
  Filled 2021-02-17: qty 2

## 2021-02-17 MED ORDER — IBUPROFEN 800 MG PO TABS: 800.0000 mg | ORAL_TABLET | Freq: Three times a day (TID) | ORAL | 0 refills | Status: DC | PRN

## 2021-02-17 MED ORDER — ACETAMINOPHEN 500 MG PO TABS
ORAL_TABLET | ORAL | Status: AC
  Filled 2021-02-17: qty 2

## 2021-02-17 MED ORDER — DEXAMETHASONE SODIUM PHOSPHATE 10 MG/ML IJ SOLN
INTRAMUSCULAR | Status: AC
  Filled 2021-02-17: qty 1

## 2021-02-17 MED ORDER — FENTANYL CITRATE (PF) 100 MCG/2ML IJ SOLN: 25.0000 ug | INTRAMUSCULAR | Status: DC | PRN

## 2021-02-17 MED ORDER — ACETAMINOPHEN 325 MG PO TABS: 325.0000 mg | ORAL_TABLET | ORAL | Status: DC | PRN

## 2021-02-17 MED ORDER — LACTATED RINGERS IV SOLN: INTRAVENOUS | Status: DC

## 2021-02-17 MED ORDER — FENTANYL CITRATE (PF) 100 MCG/2ML IJ SOLN
INTRAMUSCULAR | Status: DC | PRN
  Administered 2021-02-17 (×4): 25 ug via INTRAVENOUS

## 2021-02-17 MED ORDER — OXYCODONE HCL 5 MG PO TABS: 5.0000 mg | ORAL_TABLET | Freq: Once | ORAL | Status: DC | PRN

## 2021-02-17 MED ORDER — SODIUM CHLORIDE 0.9 % IR SOLN
Status: DC | PRN
  Administered 2021-02-17: 3000 mL

## 2021-02-17 MED ORDER — KETOROLAC TROMETHAMINE 30 MG/ML IJ SOLN
INTRAMUSCULAR | Status: DC | PRN
  Administered 2021-02-17: 15 mg via INTRAVENOUS

## 2021-02-17 SURGICAL SUPPLY — 20 items
ABLATOR SURESOUND NOVASURE (ABLATOR) ×1 IMPLANT
CANISTER SUCT 3000ML PPV (MISCELLANEOUS) ×2 IMPLANT
CATH ROBINSON RED A/P 16FR (CATHETERS) ×2 IMPLANT
CNTNR URN SCR LID CUP LEK RST (MISCELLANEOUS) ×2 IMPLANT
CONT SPEC 4OZ STRL OR WHT (MISCELLANEOUS) ×4
COVER WAND RF STERILE (DRAPES) ×2 IMPLANT
DILATOR CANAL MILEX (MISCELLANEOUS) IMPLANT
ELECT REM PT RETURN 9FT ADLT (ELECTROSURGICAL)
ELECTRODE REM PT RTRN 9FT ADLT (ELECTROSURGICAL) IMPLANT
GAUZE 4X4 16PLY RFD (DISPOSABLE) ×2 IMPLANT
GLOVE SURG LTX SZ6.5 (GLOVE) ×2 IMPLANT
GLOVE SURG UNDER POLY LF SZ7 (GLOVE) ×4 IMPLANT
GOWN STRL REUS W/TWL LRG LVL3 (GOWN DISPOSABLE) ×4 IMPLANT
IV NS IRRIG 3000ML ARTHROMATIC (IV SOLUTION) ×2 IMPLANT
KIT PROCEDURE FLUENT (KITS) ×2 IMPLANT
KIT TURNOVER CYSTO (KITS) ×2 IMPLANT
PACK VAGINAL MINOR WOMEN LF (CUSTOM PROCEDURE TRAY) ×2 IMPLANT
PAD OB MATERNITY 4.3X12.25 (PERSONAL CARE ITEMS) ×2 IMPLANT
SEAL ROD LENS SCOPE MYOSURE (ABLATOR) ×2 IMPLANT
TOWEL OR 17X26 10 PK STRL BLUE (TOWEL DISPOSABLE) ×4 IMPLANT

## 2021-02-17 NOTE — H&P (Signed)
Carrie Higgins is an 50 y.o. female G1P1 here for treatment of menorrhagia with irregular bleeding with hysteroscopy and endometrial ablation.  Pt had an endometrial biopsy on 08/20/20 that was benign and ultrasound 11/21/19 showing uterus measuring8.1 x 5.9 x 4.4cm with four small fibroids measuring 0.9cm, 1.3cm, 1.1cm and 1.7cm.  She has been on POPs without significant improvement.  She is not interested in an IUD or estrogen containing pills.  She is desirous of treatment that is non hormonal.  Risks and benefits as well as alternatives have been discussed.  As she desires non hormonal therapy and does not want a hysterectomy, this is the most appropriate option for her at this time.    Husband has undergone a vasectomy and she knows she should not be pregnant in the future after this procedure.     Pertinent Gynecological History: Menses: heavy and irregular Contraception: vasectomy DES exposure: denies Blood transfusions: none Sexually transmitted diseases: no past history Previous GYN Procedures: colposcopy with hx of abnormal pap smear  Last mammogram: normal Date: 06/24/2020 Last pap: normal Date: 08/07/2018 OB History: G1, P1     Past Medical History:  Diagnosis Date  . Anxiety   . Environmental and seasonal allergies   . Family history of adverse reaction to anesthesia    mother-- ponv  . History of 2019 novel coronavirus disease (COVID-19) 12/30/2020   positive result in care everywhere,  per pt very mild symptoms that resolved  . History of abnormal cervical Pap smear 2006  . History of basal cell carcinoma (BCC)    per pt has had 3 excision's  . Menorrhagia with irregular cycle   . Osteopenia    BMD age 54  . Uterine fibroid     Past Surgical History:  Procedure Laterality Date  . COLONOSCOPY WITH PROPOFOL  last one 01-23-2021    Family History  Problem Relation Age of Onset  . Heart disease Maternal Grandmother   . Diabetes Maternal Grandfather   . Heart  disease Maternal Grandfather   . Heart disease Paternal Grandfather   . Atrial fibrillation Mother   . Colon polyps Father        Precancerous  . Colon cancer Neg Hx   . Esophageal cancer Neg Hx   . Stomach cancer Neg Hx   . Rectal cancer Neg Hx     Social History:  reports that she has never smoked. She has never used smokeless tobacco. She reports current alcohol use of about 3.0 standard drinks of alcohol per week. She reports that she does not use drugs.  Allergies:  Allergies  Allergen Reactions  . Levaquin [Levofloxacin] Other (See Comments)    "numbness in arm and legs"  . Augmentin [Amoxicillin-Pot Clavulanate] Rash  . Neosporin [Neomycin-Bacitracin Zn-Polymyx] Rash    Medications Prior to Admission  Medication Sig Dispense Refill Last Dose  . APPLE CIDER VINEGAR PO Take by mouth daily. Gummies   02/16/2021 at Unknown time  . Azelastine-Fluticasone 137-50 MCG/ACT SUSP Place 1 spray into the nose as needed.   Past Week at Unknown time  . FLUoxetine (PROZAC) 20 MG capsule Take 20 mg by mouth at bedtime.   02/16/2021 at Unknown time  . fluticasone (FLONASE) 50 MCG/ACT nasal spray Place into both nostrils as needed.   Past Week at Unknown time  . levocetirizine (XYZAL) 5 MG tablet Take 5 mg by mouth every evening.   02/16/2021 at Unknown time  . LORazepam (ATIVAN) 0.5 MG tablet Take 0.5 mg  by mouth as needed for anxiety.   Past Month at Unknown time  . norethindrone (MICRONOR) 0.35 MG tablet Take 1 tablet (0.35 mg total) by mouth daily. (Patient taking differently: Take 1 tablet by mouth at bedtime.) 28 tablet 5 02/16/2021 at Unknown time  . Nutritional Supplements (JUICE PLUS FIBRE PO) Take by mouth daily. gummy   02/16/2021 at Unknown time  . olopatadine (PATANOL) 0.1 % ophthalmic solution Place 1 drop into both eyes daily as needed. Reported on 05/16/2016   Past Month at Unknown time    Review of Systems  All other systems reviewed and are negative.   Blood pressure 113/64,  pulse 71, temperature 98.8 F (37.1 C), temperature source Oral, resp. rate 14, height 5\' 2"  (1.575 m), weight 59.5 kg, last menstrual period 02/05/2021, SpO2 100 %. Physical Exam Constitutional:      Appearance: Normal appearance.  Cardiovascular:     Rate and Rhythm: Normal rate and regular rhythm.  Pulmonary:     Effort: Pulmonary effort is normal.     Breath sounds: Normal breath sounds.  Skin:    General: Skin is warm.  Neurological:     General: No focal deficit present.     Mental Status: She is alert.  Psychiatric:        Mood and Affect: Mood normal.     Results for orders placed or performed during the hospital encounter of 02/17/21 (from the past 24 hour(s))  Pregnancy, urine POC     Status: None   Collection Time: 02/17/21  8:49 AM  Result Value Ref Range   Preg Test, Ur NEGATIVE NEGATIVE  CBC     Status: None   Collection Time: 02/17/21  8:52 AM  Result Value Ref Range   WBC 5.3 4.0 - 10.5 K/uL   RBC 4.71 3.87 - 5.11 MIL/uL   Hemoglobin 14.4 12.0 - 15.0 g/dL   HCT 43.6 36.0 - 46.0 %   MCV 92.6 80.0 - 100.0 fL   MCH 30.6 26.0 - 34.0 pg   MCHC 33.0 30.0 - 36.0 g/dL   RDW 12.7 11.5 - 15.5 %   Platelets 189 150 - 400 K/uL   nRBC 0.0 0.0 - 0.2 %    No results found.  Assessment/Plan: 50 yo G1P1 MWF with menorrhagia and irregular cycles who has failed oral progesterone therapy who desires, non hormonal treatment of bleeding.  Pt here for hysteroscopy with endometrial ablation.  Questions answered.  Pt ready to proceed.  Megan Salon 02/17/2021, 10:04 AM

## 2021-02-17 NOTE — Discharge Instructions (Signed)

## 2021-02-17 NOTE — Anesthesia Postprocedure Evaluation (Signed)
Anesthesia Post Note  Patient: Carrie Higgins  Procedure(s) Performed: HYSTEROSCOPY WITH NOVASURE ABLATION, cervical polypectomy (N/A Vagina )     Patient location during evaluation: PACU Anesthesia Type: General Level of consciousness: awake and alert Pain management: pain level controlled Vital Signs Assessment: post-procedure vital signs reviewed and stable Respiratory status: spontaneous breathing, nonlabored ventilation, respiratory function stable and patient connected to nasal cannula oxygen Cardiovascular status: blood pressure returned to baseline and stable Postop Assessment: no apparent nausea or vomiting Anesthetic complications: no   No complications documented.  Last Vitals:  Vitals:   02/17/21 1130 02/17/21 1213  BP: 109/61 112/66  Pulse: 69 69  Resp: 14 14  Temp: 36.6 C 36.6 C  SpO2: 98% 100%    Last Pain:  Vitals:   02/17/21 1213  TempSrc:   PainSc: 2                  Taleisha Kaczynski

## 2021-02-17 NOTE — Transfer of Care (Signed)
Immediate Anesthesia Transfer of Care Note  Patient: Carrie Higgins  Procedure(s) Performed: Procedure(s) (LRB): HYSTEROSCOPY WITH NOVASURE ABLATION, cervical polypectomy (N/A)  Patient Location: PACU  Anesthesia Type: General  Level of Consciousness: awake, sedated, patient cooperative and responds to stimulation  Airway & Oxygen Therapy: Patient Spontanous Breathing and Patient connected to [place] 02 and soft FM   Post-op Assessment: Report given to PACU RN, Post -op Vital signs reviewed and stable and Patient moving all extremities  Post vital signs: Reviewed and stable  Complications: No apparent anesthesia complications

## 2021-02-17 NOTE — Anesthesia Preprocedure Evaluation (Addendum)
Anesthesia Evaluation  Patient identified by MRN, date of birth, ID band Patient awake    Reviewed: Allergy & Precautions, NPO status , Patient's Chart, lab work & pertinent test results  History of Anesthesia Complications (+) Family history of anesthesia reaction  Airway Mallampati: I  TM Distance: >3 FB Neck ROM: Full    Dental no notable dental hx. (+) Teeth Intact, Dental Advisory Given   Pulmonary    Pulmonary exam normal breath sounds clear to auscultation       Cardiovascular Normal cardiovascular exam Rhythm:Regular Rate:Normal     Neuro/Psych Anxiety    GI/Hepatic   Endo/Other    Renal/GU   negative genitourinary   Musculoskeletal   Abdominal   Peds  Hematology   Anesthesia Other Findings   Reproductive/Obstetrics                            Anesthesia Physical Anesthesia Plan  ASA: I  Anesthesia Plan: General   Post-op Pain Management:    Induction: Intravenous  PONV Risk Score and Plan: 3 and Ondansetron, Dexamethasone and Midazolam  Airway Management Planned: LMA  Additional Equipment: None  Intra-op Plan:   Post-operative Plan: Extubation in OR  Informed Consent:   Plan Discussed with: CRNA, Anesthesiologist and Surgeon  Anesthesia Plan Comments: ( )       Anesthesia Quick Evaluation

## 2021-02-17 NOTE — Anesthesia Procedure Notes (Signed)
Procedure Name: LMA Insertion Date/Time: 02/17/2021 10:22 AM Performed by: Justice Rocher, CRNA Pre-anesthesia Checklist: Patient identified, Emergency Drugs available, Suction available, Patient being monitored and Timeout performed Patient Re-evaluated:Patient Re-evaluated prior to induction Oxygen Delivery Method: Circle system utilized Preoxygenation: Pre-oxygenation with 100% oxygen Induction Type: IV induction Ventilation: Mask ventilation without difficulty LMA: LMA inserted LMA Size: 3.0 Number of attempts: 1 Airway Equipment and Method: Bite block Placement Confirmation: positive ETCO2,  breath sounds checked- equal and bilateral and CO2 detector Tube secured with: Tape Dental Injury: Teeth and Oropharynx as per pre-operative assessment

## 2021-02-17 NOTE — Op Note (Signed)
02/17/2021  10:52 AM  PATIENT:  Carrie Higgins  50 y.o. female  PRE-OPERATIVE DIAGNOSIS:  Menorrhagia with irregular bleeding, cervical polyp, spouse with vasectomy  POST-OPERATIVE DIAGNOSIS: same  PROCEDURE:  Procedure(s): HYSTEROSCOPY WITH NOVASURE ABLATION, cervical polypectomy  SURGEON:  Megan Salon  ASSISTANTS: OR staff.    ANESTHESIA:   general  ESTIMATED BLOOD LOSS: 5 mL  BLOOD ADMINISTERED:none   FLUIDS: 300cc LR  UOP: 25 cc drained with I&O cath at beginning of procedure  SPECIMEN:  Endocervical polyp  DISPOSITION OF SPECIMEN:  PATHOLOGY  FINDINGS: thin endometrium, no intracavitary lesions  DESCRIPTION OF OPERATION: Patient was taken to the operating room.  She is placed in the supine position. SCDs were on her lower extremities and functioning properly. General anesthesia with an LMA was administered without difficulty. Dr. Rayetta Pigg, anesthesia, oversaw case.  Legs were then placed in the Syl[place] in the low lithotomy position. The legs were lifted to the high lithotomy position and the Betadine prep was used on the inner thighs perineum and vagina x3. Patient was draped in a normal standard fashion. An in and out catheterization with a red rubber Foley catheter was performed. Approximately 25 cc of clear urine was noted. A bivalve speculum was placed the vagina. The anterior lip of the cervix was grasped with single-tooth tenaculum.  A paracervical block of 1% lidocaine mixed one-to-one with epinephrine (1:100,000 units).  10 cc was used total. The cervix is dilated up to #[facility] dilators. The endometrial cavity sounded to 19 cm.  There was a cervical polyp present, this was grasped with a polyp forcep and removed by twisting at the base.  This was sent to pathology.   A Myosure millimeter diagnostic hysteroscope was obtained. Normal saline was used as a hysteroscopic fluid. The hysteroscope was advanced through the endocervical canal into the endometrial  cavity. The tubal ostia were noted bilaterally.  The hysteroscope was removed. A novasure device was obtained.  This was opened outside the uterus to ensure device was working properly.  Array was closed and device passed to the fundus of the uterus.  This was retracted slightly.  The cavity length was 4.0cm.  Once array was opened, the device was seated.  Cavity width was 3.1cm.  Power was 68.  Cavity assessment test was performed and passed.  Then ablation cycle initiated.  This lasted 57 second.  Once ablation complete, the device was closed and removed  Cavity was revisualized again and entire cavity was pale in appearance.  At this point no other procedure was needed and this procedure was ended. The hysteroscope was removed. The fluid deficit was 100 cc. The tenaculum was removed from the anterior lip of the cervix. The speculum was removed from the vagina. The prep was cleansed of the patient's skin. The legs are positioned back in the supine position. Sponge, lap, needle, initially counts were correct x2. Patient was taken to recovery in stable condition.  COUNTS:  YES  PLAN OF CARE: Transfer to PACU

## 2021-02-18 ENCOUNTER — Encounter (HOSPITAL_BASED_OUTPATIENT_CLINIC_OR_DEPARTMENT_OTHER): Payer: Self-pay | Admitting: Obstetrics & Gynecology

## 2021-02-18 LAB — SURGICAL PATHOLOGY

## 2021-03-25 ENCOUNTER — Encounter (HOSPITAL_BASED_OUTPATIENT_CLINIC_OR_DEPARTMENT_OTHER): Payer: Self-pay | Admitting: Obstetrics & Gynecology

## 2021-03-25 ENCOUNTER — Ambulatory Visit (INDEPENDENT_AMBULATORY_CARE_PROVIDER_SITE_OTHER): Payer: BC Managed Care – PPO | Admitting: Obstetrics & Gynecology

## 2021-03-25 ENCOUNTER — Other Ambulatory Visit: Payer: Self-pay

## 2021-03-25 VITALS — BP 106/66 | HR 64 | Ht 62.0 in | Wt 131.0 lb

## 2021-03-25 DIAGNOSIS — N841 Polyp of cervix uteri: Secondary | ICD-10-CM | POA: Diagnosis not present

## 2021-03-25 DIAGNOSIS — D251 Intramural leiomyoma of uterus: Secondary | ICD-10-CM

## 2021-03-25 DIAGNOSIS — N921 Excessive and frequent menstruation with irregular cycle: Secondary | ICD-10-CM

## 2021-03-25 NOTE — Progress Notes (Signed)
GYNECOLOGY  VISIT  CC:   Recheck after endometrial ablation  HPI: 50 y.o. G1P1 Married White or Caucasian female here for recheck after undergoing hysteroscopy and cervical polyp resection, endometrial ablation on 4/202/2022.  She is not having any bleeding.  Having some discharge but this improving.  Cramping lasted 24 hours.  Used motrin only.  Did have some post op constipation.  She did see some bright red blood with bowel movement.  This has resolved.  Did have some low back pain.    Pathology reviewed:  Yes .  Questions answered.    MEDS:   Current Outpatient Medications on File Prior to Visit  Medication Sig Dispense Refill  . APPLE CIDER VINEGAR PO Take by mouth daily. Gummies    . Azelastine-Fluticasone 137-50 MCG/ACT SUSP Place 1 spray into the nose as needed.    Marland Kitchen FLUoxetine (PROZAC) 20 MG capsule Take 20 mg by mouth at bedtime.    . fluticasone (FLONASE) 50 MCG/ACT nasal spray Place into both nostrils as needed.    Marland Kitchen levocetirizine (XYZAL) 5 MG tablet Take 5 mg by mouth every evening.    Marland Kitchen LORazepam (ATIVAN) 0.5 MG tablet Take 0.5 mg by mouth as needed for anxiety.    . Nutritional Supplements (JUICE PLUS FIBRE PO) Take by mouth daily. gummy    . olopatadine (PATANOL) 0.1 % ophthalmic solution Place 1 drop into both eyes daily as needed. Reported on 05/16/2016     No current facility-administered medications on file prior to visit.    SH:  Smoking No    PHYSICAL EXAMINATION:    BP 106/66   Pulse 64   Ht 5\' 2"  (1.575 m)   Wt 131 lb (59.4 kg)   BMI 23.96 kg/m     General appearance: alert, cooperative and appears stated age CV:  Regular rate and rhythm Lungs:  clear to auscultation, no wheezes, rales or rhonchi, symmetric air entry Abdomen: soft, non-tender; bowel sounds normal; no masses,  no organomegaly Incisions:  C/D/I  Pelvic: External genitalia:  no lesions              Urethra:  normal appearing urethra with no masses, tenderness or lesions               Bartholins and Skenes: normal                 Vagina: normal appearing vagina with normal color and discharge, no lesions              Cervix: no lesions              Bimanual Exam:  Uterus:  normal size, contour, position, consistency, mobility, non-tender              Adnexa: no mass, fullness, tenderness  Chaperone, Octaviano Batty, was present for exam.  Assessment/Plan: 1. Menorrhagia with irregular cycle - s/p ablation.  Doing well.  2. Intramural uterine fibroid  3. Cervical polyp - pathology reviewed and was negative

## 2021-03-26 ENCOUNTER — Ambulatory Visit (HOSPITAL_BASED_OUTPATIENT_CLINIC_OR_DEPARTMENT_OTHER): Payer: BC Managed Care – PPO | Admitting: Obstetrics & Gynecology

## 2021-05-13 ENCOUNTER — Other Ambulatory Visit: Payer: Self-pay | Admitting: Obstetrics & Gynecology

## 2021-05-13 DIAGNOSIS — Z1231 Encounter for screening mammogram for malignant neoplasm of breast: Secondary | ICD-10-CM

## 2021-05-24 DIAGNOSIS — K7689 Other specified diseases of liver: Secondary | ICD-10-CM | POA: Insufficient documentation

## 2021-06-22 ENCOUNTER — Encounter (HOSPITAL_BASED_OUTPATIENT_CLINIC_OR_DEPARTMENT_OTHER): Payer: Self-pay

## 2021-07-06 ENCOUNTER — Other Ambulatory Visit: Payer: Self-pay

## 2021-07-06 ENCOUNTER — Ambulatory Visit
Admission: RE | Admit: 2021-07-06 | Discharge: 2021-07-06 | Disposition: A | Discharge status: Home / Self Care | Payer: BC Managed Care – PPO | Source: Ambulatory Visit

## 2021-07-06 DIAGNOSIS — Z1231 Encounter for screening mammogram for malignant neoplasm of breast: Secondary | ICD-10-CM

## 2021-09-02 ENCOUNTER — Other Ambulatory Visit: Payer: Self-pay

## 2021-09-02 ENCOUNTER — Encounter (HOSPITAL_BASED_OUTPATIENT_CLINIC_OR_DEPARTMENT_OTHER): Payer: Self-pay | Admitting: Obstetrics & Gynecology

## 2021-09-02 ENCOUNTER — Other Ambulatory Visit (HOSPITAL_COMMUNITY)
Admission: RE | Admit: 2021-09-02 | Discharge: 2021-09-02 | Disposition: A | Discharge status: Home / Self Care | Payer: BC Managed Care – PPO | Source: Ambulatory Visit | Attending: Obstetrics & Gynecology | Admitting: Obstetrics & Gynecology

## 2021-09-02 ENCOUNTER — Ambulatory Visit (INDEPENDENT_AMBULATORY_CARE_PROVIDER_SITE_OTHER): Payer: BC Managed Care – PPO | Admitting: Obstetrics & Gynecology

## 2021-09-02 VITALS — BP 108/59 | HR 57 | Ht 62.0 in | Wt 137.0 lb

## 2021-09-02 DIAGNOSIS — Z9889 Other specified postprocedural states: Secondary | ICD-10-CM | POA: Diagnosis not present

## 2021-09-02 DIAGNOSIS — Z124 Encounter for screening for malignant neoplasm of cervix: Secondary | ICD-10-CM | POA: Insufficient documentation

## 2021-09-02 DIAGNOSIS — Z8742 Personal history of other diseases of the female genital tract: Secondary | ICD-10-CM

## 2021-09-02 DIAGNOSIS — Z8371 Family history of colonic polyps: Secondary | ICD-10-CM | POA: Diagnosis not present

## 2021-09-02 DIAGNOSIS — Z86010 Personal history of colonic polyps: Secondary | ICD-10-CM

## 2021-09-02 DIAGNOSIS — Z1159 Encounter for screening for other viral diseases: Secondary | ICD-10-CM

## 2021-09-02 DIAGNOSIS — Z01419 Encounter for gynecological examination (general) (routine) without abnormal findings: Secondary | ICD-10-CM | POA: Diagnosis not present

## 2021-09-02 DIAGNOSIS — Z114 Encounter for screening for human immunodeficiency virus [HIV]: Secondary | ICD-10-CM

## 2021-09-02 NOTE — Progress Notes (Signed)
50 y.o. G1P1 Married White or Caucasian female here for annual exam.  Doing well.  Had an endometrial ablation in April.  She had some irregular spotting for the first to three months.  Since then, she's had very bleeding.  She is pleased with the outcome.      No LMP recorded. Patient has had an ablation.          Sexually active: Yes.    The current method of family planning is vasectomy.    Exercising: Yes.     Barre classes Smoker:  no  Health Maintenance: Pap:  08/07/2018 Negative History of abnormal Pap:  h/o LEEP with CIN 1 MMG:  07/06/2021 Negative Colonoscopy:  01/22/2021, follow up 5 years Screening Labs: 01/2021, at [facility] and reviewed in Care everywhere   reports that she has never smoked. She has never used smokeless tobacco. She reports current alcohol use of about 3.0 standard drinks per week. She reports that she does not use drugs.  Past Medical History:  Diagnosis Date   Anxiety    Environmental and seasonal allergies    Family history of adverse reaction to anesthesia    mother-- ponv   History of 2019 novel coronavirus disease (COVID-19) 12/30/2020   positive result in care everywhere,  per pt very mild symptoms that resolved   History of abnormal cervical Pap smear 2006   History of basal cell carcinoma (BCC)    per pt has had 3 excision's   Osteopenia    BMD age 51   Uterine fibroid     Past Surgical History:  Procedure Laterality Date   COLONOSCOPY WITH PROPOFOL  last one 01-23-2021   HYSTEROSCOPY WITH NOVASURE N/A 02/17/2021   Procedure: HYSTEROSCOPY WITH NOVASURE ABLATION, cervical polypectomy;  Surgeon: Megan Salon, MD;  Location: [facility];  Service: Gynecology;  Laterality: N/A;    Current Outpatient Medications  Medication Sig Dispense Refill   APPLE CIDER VINEGAR PO Take by mouth daily. Gummies     Azelastine-Fluticasone 137-50 MCG/ACT SUSP Place 1 spray into the nose as needed.     FLUoxetine (PROZAC) 20 MG capsule Take 20  mg by mouth at bedtime.     fluticasone (FLONASE) 50 MCG/ACT nasal spray Place into both nostrils as needed.     levocetirizine (XYZAL) 5 MG tablet Take 5 mg by mouth every evening.     LORazepam (ATIVAN) 0.5 MG tablet Take 0.5 mg by mouth as needed for anxiety.     Nutritional Supplements (JUICE PLUS FIBRE PO) Take by mouth daily. gummy     olopatadine (PATANOL) 0.1 % ophthalmic solution Place 1 drop into both eyes daily as needed. Reported on 05/16/2016     No current facility-administered medications for this visit.    Family History  Problem Relation Age of Onset   Heart disease Maternal Grandmother    Diabetes Maternal Grandfather    Heart disease Maternal Grandfather    Heart disease Paternal Grandfather    Atrial fibrillation Mother    Colon polyps Father        Precancerous   Colon cancer Neg Hx    Esophageal cancer Neg Hx    Stomach cancer Neg Hx    Rectal cancer Neg Hx     Review of Systems  All other systems reviewed and are negative.  Exam:   BP (!) 108/59 (BP Location: Right Arm, Patient Position: Sitting, Cuff Size: Normal)   Pulse (!) 57   Ht 5\' 2"  (1.575  m) Comment: reported  Wt 137 lb (62.1 kg)   BMI 25.06 kg/m   Height: 5\' 2"  (157.5 cm) (reported)  General appearance: alert, cooperative and appears stated age Head: Normocephalic, without obvious abnormality, atraumatic Neck: no adenopathy, supple, symmetrical, trachea midline and thyroid normal to inspection and palpation Lungs: clear to auscultation bilaterally Breasts: normal appearance, no masses or tenderness Heart: regular rate and rhythm Abdomen: soft, non-tender; bowel sounds normal; no masses,  no organomegaly Extremities: extremities normal, atraumatic, no cyanosis or edema Skin: Skin color, texture, turgor normal. No rashes or lesions Lymph nodes: Cervical, supraclavicular, and axillary nodes normal. No abnormal inguinal nodes palpated Neurologic: Grossly normal   Pelvic: External  genitalia:  no lesions              Urethra:  normal appearing urethra with no masses, tenderness or lesions              Bartholins and Skenes: normal                 Vagina: normal appearing vagina with normal color and no discharge, no lesions              Cervix: no lesions              Pap taken: Yes.   Bimanual Exam:  Uterus:  normal size, contour, position, consistency, mobility, non-tender              Adnexa: normal adnexa and no mass, fullness, tenderness               Rectovaginal: Confirms               Anus:  normal sphincter tone, no lesions  Chaperone, Octaviano Batty, CMA, was present for exam.  Assessment/Plan: 1. Well woman exam with routine gynecological exam - pap smear obtained today with HR HPV - MMG 07/06/2021 - colonoscopy 01/22/2021 - vaccines reviewed/updated - lab work done 01/2021.  Reviewed in care everywhere  2. History of endometrial ablation - bleeding is much improved  3. 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 History of abnormal cervical Pap smear  5. Family history of colonic polyps  6. Personal history of colonic polyps  7. Encounter for hepatitis C screening test for low risk patient - Hepatitis C antibody  8. Encounter for screening for HIV - HIV antibody (with reflex)

## 2021-09-03 LAB — HIV ANTIBODY (ROUTINE TESTING W REFLEX): HIV Screen 4th Generation wRfx: NONREACTIVE

## 2021-09-03 LAB — HEPATITIS C ANTIBODY: Hep C Virus Ab: <0.1 s/co ratio (ref 0.0–0.9)

## 2021-09-05 DIAGNOSIS — Z9889 Other specified postprocedural states: Secondary | ICD-10-CM | POA: Insufficient documentation

## 2021-09-05 DIAGNOSIS — Z8371 Family history of colonic polyps: Secondary | ICD-10-CM | POA: Insufficient documentation

## 2021-09-05 DIAGNOSIS — Z860100 Personal history of colon polyps, unspecified: Secondary | ICD-10-CM | POA: Insufficient documentation

## 2021-09-06 LAB — CYTOLOGY - PAP
Comment: NEGATIVE
Diagnosis: NEGATIVE
High risk HPV: NEGATIVE

## 2021-12-21 ENCOUNTER — Ambulatory Visit (INDEPENDENT_AMBULATORY_CARE_PROVIDER_SITE_OTHER): Payer: BC Managed Care – PPO | Admitting: Physician Assistant

## 2021-12-21 ENCOUNTER — Encounter: Payer: Self-pay | Admitting: Physician Assistant

## 2021-12-21 ENCOUNTER — Other Ambulatory Visit: Payer: BC Managed Care – PPO

## 2021-12-21 VITALS — BP 110/70 | HR 76 | Ht 61.5 in | Wt 137.1 lb

## 2021-12-21 DIAGNOSIS — K7689 Other specified diseases of liver: Secondary | ICD-10-CM | POA: Diagnosis not present

## 2021-12-21 DIAGNOSIS — D1803 Hemangioma of intra-abdominal structures: Secondary | ICD-10-CM

## 2021-12-21 DIAGNOSIS — R0989 Other specified symptoms and signs involving the circulatory and respiratory systems: Secondary | ICD-10-CM

## 2021-12-21 DIAGNOSIS — R09A2 Foreign body sensation, throat: Secondary | ICD-10-CM

## 2021-12-21 DIAGNOSIS — K219 Gastro-esophageal reflux disease without esophagitis: Secondary | ICD-10-CM

## 2021-12-21 LAB — HEPATIC FUNCTION PANEL
ALT: 11 U/L (ref 0–35)
AST: 15 U/L (ref 0–37)
Albumin: 4.4 g/dL (ref 3.5–5.2)
Alkaline Phosphatase: 50 U/L (ref 39–117)
Bilirubin, Direct: 0 mg/dL (ref 0.0–0.3)
Total Bilirubin: 0.5 mg/dL (ref 0.2–1.2)
Total Protein: 7 g/dL (ref 6.0–8.3)

## 2021-12-21 MED ORDER — FAMOTIDINE 20 MG PO TABS: 20.0000 mg | ORAL_TABLET | Freq: Every morning | ORAL | 0 refills | Status: DC

## 2021-12-21 NOTE — Progress Notes (Signed)
Addendum: Reviewed and agree with assessment and management plan. Carnisha Feltz M, MD  

## 2021-12-21 NOTE — Progress Notes (Signed)
Chief Complaint: Hemangioma and liver cyst  HPI:    Carrie Higgins is a 51 year old female with a past medical history as listed below including osteopenia, known to Dr. Hilarie Fredrickson, and who was referred to me by Aura Dials, PA-C for a complaint of abnormal CT showing hemangioma and liver cyst.    01/22/2021 colonoscopy with a 6 mm polyp removed from the transverse colon.  This was a sessile serrated polyp and repeat recommended in 5 years.    11/24/2021 ultrasound of the liver showed slight enlargement, 4 hyperechoic lesions largest measuring 16 x 10 x 11 mm, additional lesions measuring 5 x 3 x 6, 3 x 3 x 3 and 14 x 11 x 13.  Discussed although most likely hemangiomas in the left lower lobe with a roughly 1 cm avascular cyst.  This was reviewed by Dr. 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    Today, the patient tells me the only reason she found out about her liver was because she had a cardiac calcium score done and they saw the tip of her liver and noticed this and then she had a dedicated ultrasound.  Denies any abdominal pain or other symptoms.  She has not had labs drawn since May of last year.    Does discuss globus sensation today and tells me that at times she can feel like there is something in her throat like a "pill got stuck", but it can be hours after eating or when she is just laying down to sleep or just randomly throughout the day not necessarily related to eating at all.  Denies any true dysphagia symptoms.  Also tells me that sometimes she can feel her "heartbeat there".    Denies fever, chills, weight loss or family history of liver disease.  Past Medical History:  Diagnosis Date   Anxiety    Environmental and seasonal allergies    Family history of adverse reaction to anesthesia    mother-- ponv   History of 2019 novel coronavirus disease (COVID-19) 12/30/2020   positive result in care everywhere,  per pt very mild  symptoms that resolved   History of abnormal cervical Pap smear 2006   History of basal cell carcinoma (BCC)    per pt has had 3 excision's   Osteopenia    BMD age 63   Uterine fibroid     Past Surgical History:  Procedure Laterality Date   COLONOSCOPY WITH PROPOFOL  last one 01-23-2021   HYSTEROSCOPY WITH NOVASURE N/A 02/17/2021   Procedure: HYSTEROSCOPY WITH NOVASURE ABLATION, cervical polypectomy;  Surgeon: Megan Salon, MD;  Location: [facility];  Service: Gynecology;  Laterality: N/A;    Current Outpatient Medications  Medication Sig Dispense Refill   APPLE CIDER VINEGAR PO Take by mouth daily. Gummies     Azelastine-Fluticasone 137-50 MCG/ACT SUSP Place 1 spray into the nose as needed.     FLUoxetine (PROZAC) 20 MG capsule Take 20 mg by mouth at bedtime.     fluticasone (FLONASE) 50 MCG/ACT nasal spray Place into both nostrils as needed.     levocetirizine (XYZAL) 5 MG tablet Take 5 mg by mouth every evening.     LORazepam (ATIVAN) 0.5 MG tablet Take 0.5 mg by mouth as needed for anxiety.     Nutritional Supplements (JUICE PLUS FIBRE PO) Take by mouth daily. gummy     olopatadine (PATANOL) 0.1 % ophthalmic solution Place 1 drop  into both eyes daily as needed. Reported on 05/16/2016     No current facility-administered medications for this visit.    Allergies as of 12/21/2021 - Review Complete 12/21/2021  Allergen Reaction Noted   Levaquin [levofloxacin] Other (See Comments) 10/23/2013   Augmentin [amoxicillin-pot clavulanate] Rash 03/01/2013   Neosporin [neomycin-bacitracin zn-polymyx] Rash 03/01/2013    Family History  Problem Relation Age of Onset   Heart disease Maternal Grandmother    Diabetes Maternal Grandfather    Heart disease Maternal Grandfather    Heart disease Paternal Grandfather    Atrial fibrillation Mother    Colon polyps Father        Precancerous   Colon cancer Neg Hx    Esophageal cancer Neg Hx    Stomach cancer Neg Hx     Rectal cancer Neg Hx     Social History   Socioeconomic History   Marital status: Married    Spouse name: Not on file   Number of children: 1   Years of education: Not on file   Highest education level: Not on file  Occupational History   Not on file  Tobacco Use   Smoking status: Never   Smokeless tobacco: Never  Vaping Use   Vaping Use: Never used  Substance and Sexual Activity   Alcohol use: Yes    Alcohol/week: 3.0 standard drinks    Types: 3 Glasses of wine per week   Drug use: Never   Sexual activity: Yes    Partners: Male    Birth control/protection: Other-see comments, Pill    Comment: Husband had vasectomy  Other Topics Concern   Not on file  Social History Narrative   Not on file   Social Determinants of Health   Financial Resource Strain: Not on file  Food Insecurity: Not on file  Transportation Needs: Not on file  Physical Activity: Not on file  Stress: Not on file  Social Connections: Not on file  Intimate Partner Violence: Not on file    Review of Systems:    Constitutional: No weight loss, fever or chills Cardiovascular: No chest pain Respiratory: No SOB  Gastrointestinal: See HPI and otherwise negative   Physical Exam:  Vital signs: BP 110/70 (BP Location: Left Arm, Patient Position: Sitting, Cuff Size: Normal)    Pulse 76    Ht 5' 1.5" (1.562 m) Comment: height measured without shoes   Wt 137 lb 2 oz (62.2 kg)    BMI 25.49 kg/m   Constitutional:   Pleasant Caucasian female appears to be in NAD, Well developed, Well nourished, alert and cooperative Respiratory: Respirations even and unlabored. Lungs clear to auscultation bilaterally.   No wheezes, crackles, or rhonchi.  Cardiovascular: Normal S1, S2. No MRG. Regular rate and rhythm. No peripheral edema, cyanosis or pallor.  Gastrointestinal:  Soft, nondistended, nontender. No rebound or guarding. Normal bowel sounds. No appreciable masses or hepatomegaly. Rectal:  Not performed.  Psychiatric:   Demonstrates good judgement and reason without abnormal affect or behaviors.  RELEVANT LABS AND IMAGING: CBC    Component Value Date/Time   WBC 5.3 02/17/2021 0852   RBC 4.71 02/17/2021 0852   HGB 14.4 02/17/2021 0852   HGB 13.4 08/07/2018 0922   HCT 43.6 02/17/2021 0852   HCT 41.1 08/07/2018 0922   PLT 189 02/17/2021 0852   PLT 220 08/07/2018 0922   MCV 92.6 02/17/2021 0852   MCV 91 08/07/2018 0922   MCH 30.6 02/17/2021 0852   MCHC 33.0 02/17/2021 0852  RDW 12.7 02/17/2021 0852   RDW 13.4 08/07/2018 0922   LYMPHSABS 1.6 10/23/2013 0949   MONOABS 0.4 10/23/2013 0949   EOSABS 0.2 10/23/2013 0949   BASOSABS 0.0 10/23/2013 0949    CMP     Component Value Date/Time   NA 139 08/07/2018 0922   K 3.9 08/07/2018 0922   CL 100 08/07/2018 0922   CO2 23 08/07/2018 0922   GLUCOSE 75 08/07/2018 0922   GLUCOSE 87 10/23/2013 0949   BUN 13 08/07/2018 0922   CREATININE 0.72 08/07/2018 0922   CREATININE 0.72 10/23/2013 0949   CALCIUM 9.2 08/07/2018 0922   PROT 6.4 08/07/2018 0922   ALBUMIN 4.1 08/07/2018 0922   AST 14 08/07/2018 0922   ALT 14 08/07/2018 0922   ALKPHOS 46 08/07/2018 0922   BILITOT 0.3 08/07/2018 0922   GFRNONAA 100 08/07/2018 0922   GFRAA 115 08/07/2018 0922    Assessment: 1.  Liver cysts and hemangioma: Seen at time of recent ultrasound, reviewed by Dr. Hilarie Fredrickson and thought completely benign 2.  Globus sensation: Off-and-on for the past year or so, maybe once a week, no true dysphagia, previous history of reflux; consider esophagitis +/- reflux or stricture versus other  Plan: 1.  Ordered barium swallow with tablet for further evaluation of globus sensation. 2.  Recommend the patient do a trial of Pepcid 20 mg every morning for the next 2 weeks to see if this helps. 3.  Discussed liver cyst hemangioma.  Explained that these are completely benign.  We will check her liver enzymes to ensure she does not need any further imaging, but ultrasound of previously been  reviewed by Dr. Hilarie Fredrickson and he recommended no need to follow-up as long as everything else was okay including her enzymes. 4.  Patient to follow in clinic per recommendations after above.  Ellouise Newer, PA-C [facility] 12/21/2021, 10:44 AM  Cc: Aura Dials, PA-C

## 2021-12-21 NOTE — Patient Instructions (Signed)
If you are age 51 or younger, your body mass index should be between 19-25. Your Body mass index is 25.49 kg/m. If this is out of the aformentioned range listed, please consider follow up with your Primary Care Provider.  ________________________________________________________  The [facility] providers would like to encourage you to use [facility] to communicate with providers for non-urgent requests or questions.  Due to long hold times on the telephone, sending your provider a message by [facility] may be a faster and more efficient way to get a response.  Please allow 48 business hours for a response.  Please remember that this is for non-urgent requests.  _______________________________________________________  Carrie Higgins have been scheduled for a Barium Esophogram at [facility] (1st floor of the hospital) on 12/28/2021 at 10:30 am. Please arrive 15 minutes prior to your appointment for registration. Make certain not to have anything to eat or drink 3 hours prior to your test. If you need to reschedule for any reason, please contact radiology at [phone] to do so. __________________________________________________________________ A barium swallow is an examination that concentrates on views of the esophagus. This tends to be a double contrast exam (barium and two liquids which, when combined, create a gas to distend the wall of the oesophagus) or single contrast (non-ionic iodine based). The study is usually tailored to your symptoms so a good history is essential. Attention is paid during the study to the form, structure and configuration of the esophagus, looking for functional disorders (such as aspiration, dysphagia, achalasia, motility and reflux) EXAMINATION You may be asked to change into a gown, depending on the type of swallow being performed. A radiologist and radiographer will perform the procedure. The radiologist will advise you of the type of contrast selected for your procedure and  direct you during the exam. You will be asked to stand, sit or lie in several different positions and to hold a small amount of fluid in your mouth before being asked to swallow while the imaging is performed .In some instances you may be asked to swallow barium coated marshmallows to assess the motility of a solid food bolus. The exam can be recorded as a digital or video fluoroscopy procedure. POST PROCEDURE It will take 1-2 days for the barium to pass through your system. To facilitate this, it is important, unless otherwise directed, to increase your fluids for the next 24-48hrs and to resume your normal diet.  This test typically takes about 30 minutes to perform. __________________________________________________________________________________  Your provider has requested that you go to the basement level for lab work before leaving today. Press "B" on the elevator. The lab is located at the first door on the left as you exit the elevator.  Use over the counter Pepcid/Famotidine 20 mg 1 tablet in the morning for 2 weeks.  Follow up pending the results of your Barium Swallow.  Thank you for entrusting me with your care and choosing [facility].  Ellouise Newer, PA-C

## 2021-12-28 ENCOUNTER — Ambulatory Visit (HOSPITAL_COMMUNITY)
Admission: RE | Admit: 2021-12-28 | Discharge: 2021-12-28 | Disposition: A | Discharge status: Home / Self Care | Payer: BC Managed Care – PPO | Source: Ambulatory Visit | Attending: Physician Assistant | Admitting: Physician Assistant

## 2021-12-28 ENCOUNTER — Other Ambulatory Visit: Payer: Self-pay

## 2021-12-28 DIAGNOSIS — K219 Gastro-esophageal reflux disease without esophagitis: Secondary | ICD-10-CM | POA: Insufficient documentation

## 2021-12-28 DIAGNOSIS — R0989 Other specified symptoms and signs involving the circulatory and respiratory systems: Secondary | ICD-10-CM | POA: Insufficient documentation

## 2022-05-31 ENCOUNTER — Other Ambulatory Visit: Payer: Self-pay | Admitting: Certified Registered"

## 2022-05-31 DIAGNOSIS — Z1231 Encounter for screening mammogram for malignant neoplasm of breast: Secondary | ICD-10-CM

## 2022-06-05 ENCOUNTER — Encounter (HOSPITAL_BASED_OUTPATIENT_CLINIC_OR_DEPARTMENT_OTHER): Payer: Self-pay | Admitting: Obstetrics & Gynecology

## 2022-06-07 ENCOUNTER — Ambulatory Visit (INDEPENDENT_AMBULATORY_CARE_PROVIDER_SITE_OTHER): Payer: BC Managed Care – PPO | Admitting: Advanced Practice Midwife

## 2022-06-07 ENCOUNTER — Encounter (HOSPITAL_BASED_OUTPATIENT_CLINIC_OR_DEPARTMENT_OTHER): Payer: Self-pay | Admitting: Advanced Practice Midwife

## 2022-06-07 VITALS — BP 121/61 | HR 61 | Ht 62.0 in | Wt 138.0 lb

## 2022-06-07 DIAGNOSIS — N6011 Diffuse cystic mastopathy of right breast: Secondary | ICD-10-CM

## 2022-06-07 DIAGNOSIS — N6321 Unspecified lump in the left breast, upper outer quadrant: Secondary | ICD-10-CM

## 2022-06-07 DIAGNOSIS — N6012 Diffuse cystic mastopathy of left breast: Secondary | ICD-10-CM | POA: Diagnosis not present

## 2022-06-07 NOTE — Progress Notes (Signed)
   GYNECOLOGY PROGRESS NOTE  History:  51 y.o. G1P1 presents to [facility] Draw office today for problem gyn visit. She reports feeling a small mass in the outer portion of her left breast x 2-3 days.  She has hx breast cysts but this one felt larger than usual.  She denies h/a, dizziness, shortness of breath, n/v, or fever/chills.    The following portions of the patient's history were reviewed and updated as appropriate: allergies, current medications, past family history, past medical history, past social history, past surgical history and problem list. Last pap smear on 08/07/2018 was normal, neg HRHPV.  Health Maintenance Due  Topic Date Due   Zoster Vaccines- Shingrix (1 of 2) Never done   INFLUENZA VACCINE  05/31/2022     Review of Systems:  Pertinent items are noted in HPI.   Objective:  Physical Exam Blood pressure 121/61, pulse 61, height '5\' 2"'$  (1.575 m), weight 138 lb (62.6 kg). VS reviewed, nursing note reviewed,  Constitutional: well developed, well nourished, no distress HEENT: normocephalic CV: normal rate Pulm/chest wall: normal effort Breast Exam: right breast with some ropelike smooth round mobile masses in upper outer quadrant, no other abnormalities, left breast with similar small masses but one also ropelike smooth round mobile but ~ 0.5 - 1 cm in diameter to palpation in mid outer portion of left breast, at 3 o'clock Abdomen: soft Neuro: alert and oriented x 3 Skin: warm, dry Psych: affect normal Pelvic exam: Deferred .diag Assessment & Plan:  1. Mass of upper outer quadrant of left breast --Discussed benign findings on CBE today but will follow up with diagnostic imaging to confirm.  - MM Digital Diagnostic Unilat L; Future  2. Fibrocystic breast changes of both breasts    Return if symptoms worsen or fail to improve.   Fatima Blank, CNM 6:30 PM

## 2022-06-08 ENCOUNTER — Other Ambulatory Visit (HOSPITAL_BASED_OUTPATIENT_CLINIC_OR_DEPARTMENT_OTHER): Payer: Self-pay | Admitting: Advanced Practice Midwife

## 2022-06-08 DIAGNOSIS — N6321 Unspecified lump in the left breast, upper outer quadrant: Secondary | ICD-10-CM

## 2022-06-14 ENCOUNTER — Other Ambulatory Visit: Payer: Self-pay | Admitting: Urology

## 2022-06-14 DIAGNOSIS — N281 Cyst of kidney, acquired: Secondary | ICD-10-CM

## 2022-06-30 ENCOUNTER — Ambulatory Visit
Admission: RE | Admit: 2022-06-30 | Discharge: 2022-06-30 | Disposition: A | Discharge status: Home / Self Care | Payer: BC Managed Care – PPO | Source: Ambulatory Visit | Attending: Urology | Admitting: Urology

## 2022-06-30 DIAGNOSIS — N281 Cyst of kidney, acquired: Secondary | ICD-10-CM

## 2022-06-30 MED ORDER — GADOBENATE DIMEGLUMINE 529 MG/ML IV SOLN
12.0000 mL | Freq: Once | INTRAVENOUS | Status: AC | PRN
  Administered 2022-06-30: 12 mL via INTRAVENOUS

## 2022-07-07 ENCOUNTER — Ambulatory Visit
Admission: RE | Admit: 2022-07-07 | Discharge: 2022-07-07 | Disposition: A | Discharge status: Home / Self Care | Payer: BC Managed Care – PPO | Source: Ambulatory Visit | Attending: Advanced Practice Midwife | Admitting: Advanced Practice Midwife

## 2022-07-07 ENCOUNTER — Ambulatory Visit: Payer: BC Managed Care – PPO

## 2022-07-07 ENCOUNTER — Other Ambulatory Visit (HOSPITAL_BASED_OUTPATIENT_CLINIC_OR_DEPARTMENT_OTHER): Payer: Self-pay | Admitting: Advanced Practice Midwife

## 2022-07-07 DIAGNOSIS — N6321 Unspecified lump in the left breast, upper outer quadrant: Secondary | ICD-10-CM

## 2022-07-12 ENCOUNTER — Other Ambulatory Visit (HOSPITAL_COMMUNITY)
Admission: RE | Admit: 2022-07-12 | Discharge: 2022-07-12 | Disposition: A | Discharge status: Home / Self Care | Payer: BC Managed Care – PPO | Source: Ambulatory Visit | Attending: Advanced Practice Midwife | Admitting: Advanced Practice Midwife

## 2022-07-12 ENCOUNTER — Ambulatory Visit
Admission: RE | Admit: 2022-07-12 | Discharge: 2022-07-12 | Disposition: A | Discharge status: Home / Self Care | Payer: BC Managed Care – PPO | Source: Ambulatory Visit | Attending: Advanced Practice Midwife | Admitting: Advanced Practice Midwife

## 2022-07-12 ENCOUNTER — Other Ambulatory Visit (HOSPITAL_BASED_OUTPATIENT_CLINIC_OR_DEPARTMENT_OTHER): Payer: Self-pay | Admitting: Advanced Practice Midwife

## 2022-07-12 DIAGNOSIS — N6321 Unspecified lump in the left breast, upper outer quadrant: Secondary | ICD-10-CM

## 2022-07-14 LAB — CYTOLOGY - NON PAP

## 2022-09-05 ENCOUNTER — Ambulatory Visit (INDEPENDENT_AMBULATORY_CARE_PROVIDER_SITE_OTHER): Payer: BC Managed Care – PPO | Admitting: Obstetrics & Gynecology

## 2022-09-05 ENCOUNTER — Encounter (HOSPITAL_BASED_OUTPATIENT_CLINIC_OR_DEPARTMENT_OTHER): Payer: Self-pay | Admitting: Obstetrics & Gynecology

## 2022-09-05 VITALS — BP 115/60 | HR 67 | Ht 62.0 in | Wt 136.0 lb

## 2022-09-05 DIAGNOSIS — F418 Other specified anxiety disorders: Secondary | ICD-10-CM | POA: Diagnosis not present

## 2022-09-05 DIAGNOSIS — Z23 Encounter for immunization: Secondary | ICD-10-CM | POA: Diagnosis not present

## 2022-09-05 DIAGNOSIS — Z01419 Encounter for gynecological examination (general) (routine) without abnormal findings: Secondary | ICD-10-CM

## 2022-09-05 DIAGNOSIS — R319 Hematuria, unspecified: Secondary | ICD-10-CM

## 2022-09-05 DIAGNOSIS — R300 Dysuria: Secondary | ICD-10-CM

## 2022-09-05 DIAGNOSIS — Z9889 Other specified postprocedural states: Secondary | ICD-10-CM

## 2022-09-05 LAB — POCT URINALYSIS DIPSTICK
Appearance: NORMAL
Bilirubin, UA: NEGATIVE
Glucose, UA: NEGATIVE
Ketones, UA: NEGATIVE
Nitrite, UA: NEGATIVE
Protein, UA: NEGATIVE
Spec Grav, UA: 1.01 (ref 1.010–1.025)
Urobilinogen, UA: 0.2 E.U./dL
pH, UA: 5.5 (ref 5.0–8.0)

## 2022-09-05 MED ORDER — LORAZEPAM 0.5 MG PO TABS: 0.5000 mg | ORAL_TABLET | ORAL | 0 refills | Status: AC | PRN

## 2022-09-05 NOTE — Progress Notes (Unsigned)
51 y.o. G1P1 Married White or Caucasian female here for annual exam.  Doing well.  Denies vaginal bleeding.  Had an endometrial ablation 01/2021.  Has small amount of bleeding for two months afterwards.  Nothing since.  Really pleased.    Had left breast biopsy in September.  Pt is feeling new lesion on the right.    Has upcoming trip to [place] and uses lorazepam for anxiety with flying.  Needs RF.    No LMP recorded. Patient has had an ablation.          Sexually active: Yes.    The current method of family planning is vasectomy.    Exercising: Yes.     [employer] class Smoker:  no  Health Maintenance: Pap:  09/02/21 neg History of abnormal Pap:  h/o LEEP with CIN 1 MMG:  07/07/22 WNL Colonoscopy:  01/22/21, follow up 5 years BMD:   Not indicated Screening Labs: 01/2022   reports that she has never smoked. She has never used smokeless tobacco. She reports current alcohol use of about 3.0 standard drinks of alcohol per week. She reports that she does not use drugs.  Past Medical History:  Diagnosis Date   Anxiety    Environmental and seasonal allergies    Family history of adverse reaction to anesthesia    mother-- ponv   History of 2019 novel coronavirus disease (COVID-19) 12/30/2020   positive result in care everywhere,  per pt very mild symptoms that resolved   History of abnormal cervical Pap smear 2006   History of basal cell carcinoma (BCC)    per pt has had 3 excision's   Osteopenia    BMD age 77   Uterine fibroid     Past Surgical History:  Procedure Laterality Date   COLONOSCOPY WITH PROPOFOL  last one 01-23-2021   HYSTEROSCOPY WITH NOVASURE N/A 02/17/2021   Procedure: HYSTEROSCOPY WITH NOVASURE ABLATION, cervical polypectomy;  Surgeon: Megan Salon, MD;  Location: [facility];  Service: Gynecology;  Laterality: N/A;    Current Outpatient Medications  Medication Sig Dispense Refill   Azelastine-Fluticasone 137-50 MCG/ACT SUSP Place 1 spray into the nose as  needed.     FLUoxetine (PROZAC) 20 MG capsule Take 20 mg by mouth at bedtime.     fluticasone (FLONASE) 50 MCG/ACT nasal spray Place into both nostrils as needed.     levocetirizine (XYZAL) 5 MG tablet Take 5 mg by mouth every evening.     LORazepam (ATIVAN) 0.5 MG tablet Take 0.5 mg by mouth as needed for anxiety.     olopatadine (PATANOL) 0.1 % ophthalmic solution Place 1 drop into both eyes daily as needed. Reported on 05/16/2016     APPLE CIDER VINEGAR PO Take by mouth daily. Gummies (Patient not taking: Reported on 09/05/2022)     No current facility-administered medications for this visit.    Family History  Problem Relation Age of Onset   Heart disease Maternal Grandmother    Diabetes Maternal Grandfather    Heart disease Maternal Grandfather    Heart disease Paternal Grandfather    Atrial fibrillation Mother    Colon polyps Father        Precancerous   Colon cancer Neg Hx    Esophageal cancer Neg Hx    Stomach cancer Neg Hx    Rectal cancer Neg Hx     ROS: Constitutional: negative Genitourinary:negative  Exam:   BP 115/60   Pulse 67   Ht '5\' 2"'$  (1.575 m)  Wt 136 lb (61.7 kg)   BMI 24.87 kg/m   Height: '5\' 2"'$  (157.5 cm)  General appearance: alert, cooperative and appears stated age Head: Normocephalic, without obvious abnormality, atraumatic Neck: no adenopathy, supple, symmetrical, trachea midline and thyroid normal to inspection and palpation Lungs: clear to auscultation bilaterally Breasts: normal appearance, no masses or tenderness, 2.5cm smooth cystic feeling breast lesion noted Heart: regular rate and rhythm Abdomen: soft, non-tender; bowel sounds normal; no masses,  no organomegaly Extremities: extremities normal, atraumatic, no cyanosis or edema Skin: Skin color, texture, turgor normal. No rashes or lesions Lymph nodes: Cervical, supraclavicular, and axillary nodes normal. No abnormal inguinal nodes palpated Neurologic: Grossly normal   Pelvic: External  genitalia:  no lesions              Urethra:  normal appearing urethra with no masses, tenderness or lesions              Bartholins and Skenes: normal                 Vagina: normal appearing vagina with normal color and no discharge, no lesions              Cervix: no lesions              Pap taken: No. Bimanual Exam:  Uterus:  normal size, contour, position, consistency, mobility, non-tender              Adnexa: normal adnexa and no mass, fullness, tenderness               Rectovaginal: Confirms               Anus:  normal sphincter tone, no lesions  Chaperone, Octaviano Batty, CMA, was present for exam.  Assessment/Plan: 1. Well woman exam with routine gynecological exam - Pap smear neg with neg HR HPV 2022 - Mammogram 07/2022 - Colonoscopy 01/22/2021, follow up 10 years - lab work done with PCP - vaccines reviewed/updated.  Influenza vaccine given today.  2. Burning with urination - POCT urinalysis dipstick - Urine Culture  3. Situational anxiety - LORazepam (ATIVAN) 0.5 MG tablet; Take 1 tablet (0.5 mg total) by mouth as needed for anxiety.  Dispense: 20 tablet; Refill: 0  4. History of loop electrical excision procedure (LEEP)  5.  Cystic right breast lesion - pt is going to monitor this and if still present in 3 weeks, will plan imaging

## 2022-09-06 LAB — URINE CULTURE: Organism ID, Bacteria: NO GROWTH

## 2023-02-21 ENCOUNTER — Encounter (HOSPITAL_BASED_OUTPATIENT_CLINIC_OR_DEPARTMENT_OTHER): Payer: Self-pay | Admitting: Obstetrics & Gynecology

## 2023-02-22 ENCOUNTER — Other Ambulatory Visit (HOSPITAL_BASED_OUTPATIENT_CLINIC_OR_DEPARTMENT_OTHER): Payer: Self-pay | Admitting: Obstetrics & Gynecology

## 2023-02-22 DIAGNOSIS — N926 Irregular menstruation, unspecified: Secondary | ICD-10-CM

## 2023-03-01 ENCOUNTER — Other Ambulatory Visit (HOSPITAL_BASED_OUTPATIENT_CLINIC_OR_DEPARTMENT_OTHER): Payer: BC Managed Care – PPO

## 2023-03-01 DIAGNOSIS — N926 Irregular menstruation, unspecified: Secondary | ICD-10-CM

## 2023-03-02 LAB — FOLLICLE STIMULATING HORMONE: FSH: 118 m[IU]/mL

## 2023-03-06 ENCOUNTER — Encounter (HOSPITAL_BASED_OUTPATIENT_CLINIC_OR_DEPARTMENT_OTHER): Payer: Self-pay | Admitting: Obstetrics & Gynecology

## 2023-03-22 ENCOUNTER — Ambulatory Visit (INDEPENDENT_AMBULATORY_CARE_PROVIDER_SITE_OTHER): Payer: BC Managed Care – PPO

## 2023-03-22 ENCOUNTER — Ambulatory Visit (INDEPENDENT_AMBULATORY_CARE_PROVIDER_SITE_OTHER): Payer: BC Managed Care – PPO | Admitting: Obstetrics & Gynecology

## 2023-03-22 DIAGNOSIS — N950 Postmenopausal bleeding: Secondary | ICD-10-CM

## 2023-03-22 DIAGNOSIS — N926 Irregular menstruation, unspecified: Secondary | ICD-10-CM

## 2023-03-28 ENCOUNTER — Encounter (HOSPITAL_BASED_OUTPATIENT_CLINIC_OR_DEPARTMENT_OTHER): Payer: Self-pay | Admitting: Obstetrics & Gynecology

## 2023-03-28 NOTE — Progress Notes (Signed)
GYNECOLOGY  VISIT  CC:   discuss ultrasound results  HPI: 52 y.o. G1P1 Married White or Caucasian female here for discussion of ultrasound that was ordered after having what felt like a normal menstrual cycle.  Pt had an endometrial ablation 02/17/2021 and has not had any bleeding since that time.  She did have some cramping before this episode of bleeding in April.  Had not had bleeding since but did have some cramping earlier this month and thought cycle was going to start.  FSH was obtained 03/01/2023 and was 118.  Ultrasound today showed small uterus.  Endometrium of 3.55mm and normal atrophic ovaries.  Images reviewed.  Findings discussed.  Last pap smear 09/02/2021 neg with neg HR HPV   Past Medical History:  Diagnosis Date   Anxiety    Environmental and seasonal allergies    Family history of adverse reaction to anesthesia    mother-- ponv   History of 2019 novel coronavirus disease (COVID-19) 12/30/2020   positive result in care everywhere,  per pt very mild symptoms that resolved   History of abnormal cervical Pap smear 2006   History of basal cell carcinoma (BCC)    per pt has had 3 excision's   Osteopenia    BMD age 60   Uterine fibroid     MEDS:   Current Outpatient Medications on File Prior to Visit  Medication Sig Dispense Refill   APPLE CIDER VINEGAR PO Take by mouth daily. Gummies (Patient not taking: Reported on 09/05/2022)     Azelastine-Fluticasone 137-50 MCG/ACT SUSP Place 1 spray into the nose as needed.     FLUoxetine (PROZAC) 20 MG capsule Take 20 mg by mouth at bedtime.     fluticasone (FLONASE) 50 MCG/ACT nasal spray Place into both nostrils as needed.     levocetirizine (XYZAL) 5 MG tablet Take 5 mg by mouth every evening.     LORazepam (ATIVAN) 0.5 MG tablet Take 1 tablet (0.5 mg total) by mouth as needed for anxiety. 20 tablet 0   olopatadine (PATANOL) 0.1 % ophthalmic solution Place 1 drop into both eyes daily as needed. Reported on 05/16/2016     No  current facility-administered medications on file prior to visit.    ALLERGIES: Levaquin [levofloxacin], Augmentin [amoxicillin-pot clavulanate], and Neosporin [neomycin-bacitracin zn-polymyx]  SH:  married, non smoker  Review of Systems  Constitutional: Negative.   Genitourinary:        PMP bleeding    PHYSICAL EXAMINATION:   Physical Exam Constitutional:      Appearance: Normal appearance.  Skin:    General: Skin is warm.  Neurological:     General: No focal deficit present.     Mental Status: She is alert.  Psychiatric:        Mood and Affect: Mood normal.     Assessment/Plan: 1. Postmenopausal bleeding - given thin appearing endometrium and no abnormal appearance, feel ok to monitor.  If pt has bleeding again, will attempt endometrial biopsy.  Pt comfortable with plan and will call with any future bleeding.

## 2023-05-17 ENCOUNTER — Other Ambulatory Visit: Payer: Self-pay | Admitting: Urology

## 2023-05-17 DIAGNOSIS — N281 Cyst of kidney, acquired: Secondary | ICD-10-CM

## 2023-07-04 ENCOUNTER — Other Ambulatory Visit: Payer: Self-pay | Admitting: Obstetrics & Gynecology

## 2023-07-04 DIAGNOSIS — Z1231 Encounter for screening mammogram for malignant neoplasm of breast: Secondary | ICD-10-CM

## 2023-07-31 ENCOUNTER — Ambulatory Visit
Admission: RE | Admit: 2023-07-31 | Discharge: 2023-07-31 | Disposition: A | Discharge status: Home / Self Care | Payer: BC Managed Care – PPO | Source: Ambulatory Visit | Attending: Urology

## 2023-07-31 DIAGNOSIS — N281 Cyst of kidney, acquired: Secondary | ICD-10-CM

## 2023-07-31 MED ORDER — GADOPICLENOL 0.5 MMOL/ML IV SOLN
7.5000 mL | Freq: Once | INTRAVENOUS | Status: AC | PRN
  Administered 2023-07-31: 7.5 mL via INTRAVENOUS

## 2023-08-01 ENCOUNTER — Ambulatory Visit
Admission: RE | Admit: 2023-08-01 | Discharge: 2023-08-01 | Disposition: A | Discharge status: Home / Self Care | Payer: BC Managed Care – PPO | Source: Ambulatory Visit

## 2023-08-01 DIAGNOSIS — Z1231 Encounter for screening mammogram for malignant neoplasm of breast: Secondary | ICD-10-CM

## 2023-09-14 ENCOUNTER — Encounter (HOSPITAL_BASED_OUTPATIENT_CLINIC_OR_DEPARTMENT_OTHER): Payer: Self-pay | Admitting: Obstetrics & Gynecology

## 2023-09-14 ENCOUNTER — Ambulatory Visit (HOSPITAL_BASED_OUTPATIENT_CLINIC_OR_DEPARTMENT_OTHER): Payer: BC Managed Care – PPO | Admitting: Obstetrics & Gynecology

## 2023-09-14 VITALS — BP 116/74 | HR 56 | Ht 62.0 in | Wt 141.4 lb

## 2023-09-14 DIAGNOSIS — M25551 Pain in right hip: Secondary | ICD-10-CM

## 2023-09-14 DIAGNOSIS — M545 Low back pain, unspecified: Secondary | ICD-10-CM

## 2023-09-14 DIAGNOSIS — Z9889 Other specified postprocedural states: Secondary | ICD-10-CM | POA: Diagnosis not present

## 2023-09-14 DIAGNOSIS — G8929 Other chronic pain: Secondary | ICD-10-CM

## 2023-09-14 DIAGNOSIS — Z01419 Encounter for gynecological examination (general) (routine) without abnormal findings: Secondary | ICD-10-CM

## 2023-09-14 NOTE — Progress Notes (Signed)
52 y.o. G1P1 Married White or Caucasian female here for annual exam.  Doing well.  Denies vaginal bleeding.  She does feel cramping from time to time.  She has noticed some right lower hip/back tightness.  This is more noticeable with prolonged standing or cooking.  Stretching makes it better and exercise also helps (walking, biking and [place] class).  This bothers her about every day.    No LMP recorded. Patient has had an ablation.          Sexually active: Yes.    The current method of family planning is post menopausal status.    Smoker:  no  Health Maintenance: Pap:  09/02/2021 Negative History of abnormal Pap:  h/o LEEP with CIN 1 in 2017 MMG:  08/01/2023 Negative Colonoscopy:  01/22/2021 Screening Labs: done with PCP   reports that she has never smoked. She has never used smokeless tobacco. She reports current alcohol use of about 3.0 standard drinks of alcohol per week. She reports that she does not use drugs.  Past Medical History:  Diagnosis Date   Anxiety    Environmental and seasonal allergies    Family history of adverse reaction to anesthesia    mother-- ponv   History of 2019 novel coronavirus disease (COVID-19) 12/30/2020   positive result in care everywhere,  per pt very mild symptoms that resolved   History of abnormal cervical Pap smear 2006   History of basal cell carcinoma (BCC)    per pt has had 3 excision's   Osteopenia    BMD age 61   Uterine fibroid     Past Surgical History:  Procedure Laterality Date   COLONOSCOPY WITH PROPOFOL  last one 01-23-2021   HYSTEROSCOPY WITH NOVASURE N/A 02/17/2021   Procedure: HYSTEROSCOPY WITH NOVASURE ABLATION, cervical polypectomy;  Surgeon: Jerene Bears, MD;  Location: [facility];  Service: Gynecology;  Laterality: N/A;    Current Outpatient Medications  Medication Sig Dispense Refill   Azelastine-Fluticasone 137-50 MCG/ACT SUSP Place 1 spray into the nose as needed.     FLUoxetine (PROZAC) 20 MG  capsule Take 20 mg by mouth at bedtime.     fluticasone (FLONASE) 50 MCG/ACT nasal spray Place into both nostrils as needed.     levocetirizine (XYZAL) 5 MG tablet Take 5 mg by mouth every evening.     LORazepam (ATIVAN) 0.5 MG tablet Take 1 tablet (0.5 mg total) by mouth as needed for anxiety. 20 tablet 0   olopatadine (PATANOL) 0.1 % ophthalmic solution Place 1 drop into both eyes daily as needed. Reported on 05/16/2016     No current facility-administered medications for this visit.    Family History  Problem Relation Age of Onset   Heart disease Maternal Grandmother    Diabetes Maternal Grandfather    Heart disease Maternal Grandfather    Heart disease Paternal Grandfather    Atrial fibrillation Mother    Colon polyps Father        Precancerous   Colon cancer Neg Hx    Esophageal cancer Neg Hx    Stomach cancer Neg Hx    Rectal cancer Neg Hx     ROS: Constitutional: negative Genitourinary:negative  Exam:   BP 116/74 (BP Location: Right Arm, Patient Position: Sitting, Cuff Size: Normal)   Pulse (!) 56   Ht 5\' 2"  (1.575 m)   Wt 141 lb 6.4 oz (64.1 kg)   BMI 25.86 kg/m   Height: 5\' 2"  (157.5 cm)  General appearance:  alert, cooperative and appears stated age Head: Normocephalic, without obvious abnormality, atraumatic Neck: no adenopathy, supple, symmetrical, trachea midline and thyroid normal to inspection and palpation Lungs: clear to auscultation bilaterally Breasts: normal appearance, no masses or tenderness Heart: regular rate and rhythm Abdomen: soft, non-tender; bowel sounds normal; no masses,  no organomegaly Extremities: extremities normal, atraumatic, no cyanosis or edema Skin: Skin color, texture, turgor normal. No rashes or lesions Lymph nodes: Cervical, supraclavicular, and axillary nodes normal. No abnormal inguinal nodes palpated Neurologic: Grossly normal   Pelvic: External genitalia:  no lesions              Urethra:  normal appearing urethra with no  masses, tenderness or lesions              Bartholins and Skenes: normal                 Vagina: normal appearing vagina with normal color and no discharge, no lesions              Cervix: no lesions              Pap taken: No. Bimanual Exam:  Uterus:  normal size, contour, position, consistency, mobility, non-tender              Adnexa: normal adnexa and no mass, fullness, tenderness               Rectovaginal: Confirms               Anus:  normal sphincter tone, no lesions  Chaperone, Ina Homes, CMA, was present for exam.  Assessment/Plan: 1. Well woman exam with routine gynecological exam - Pap smear 2022.  Will repeat next year. - Mammogram 07/2022 - Colonoscopy 2022 - Bone mineral density guidelines reviewed - lab work done with PCP - vaccines reviewed/update  2. Chronic right-sided low back pain without sciatica - AMB referral to sports medicine  3. Pain of right hip  4. History of endometrial ablation  5. H/O LEEP  - 2017

## 2023-09-25 NOTE — Progress Notes (Unsigned)
    Carrie Higgins [facility] [address] [place] [address] Phone: [phone]   Assessment and Plan:     There are no diagnoses linked to this encounter.  ***   Pertinent previous records reviewed include ***    Follow Up: ***     Subjective:   I, Carrie Higgins, am serving as a Neurosurgeon for Doctor Carrie Higgins  Chief Complaint: low back and hip pain  HPI:   09/27/2023 Patient is a 52 year old female with low back and hip pain. Patient states  Relevant Historical Information: ***  Additional pertinent review of systems negative.   Current Outpatient Medications:    Azelastine-Fluticasone 137-50 MCG/ACT SUSP, Place 1 spray into the nose as needed., Disp: , Rfl:    FLUoxetine (PROZAC) 20 MG capsule, Take 20 mg by mouth at bedtime., Disp: , Rfl:    fluticasone (FLONASE) 50 MCG/ACT nasal spray, Place into both nostrils as needed., Disp: , Rfl:    levocetirizine (XYZAL) 5 MG tablet, Take 5 mg by mouth every evening., Disp: , Rfl:    LORazepam (ATIVAN) 0.5 MG tablet, Take 1 tablet (0.5 mg total) by mouth as needed for anxiety., Disp: 20 tablet, Rfl: 0   olopatadine (PATANOL) 0.1 % ophthalmic solution, Place 1 drop into both eyes daily as needed. Reported on 05/16/2016, Disp: , Rfl:    Objective:     There were no vitals filed for this visit.    There is no height or weight on file to calculate BMI.    Physical Exam:    ***   Electronically signed by:  Carrie Higgins [facility] 4:13 PM 09/25/23

## 2023-09-27 ENCOUNTER — Ambulatory Visit: Payer: BC Managed Care – PPO | Admitting: Sports Medicine

## 2023-09-27 ENCOUNTER — Ambulatory Visit (INDEPENDENT_AMBULATORY_CARE_PROVIDER_SITE_OTHER): Payer: BC Managed Care – PPO

## 2023-09-27 VITALS — BP 108/82 | HR 57 | Ht 62.0 in | Wt 141.0 lb

## 2023-09-27 DIAGNOSIS — G8929 Other chronic pain: Secondary | ICD-10-CM

## 2023-09-27 DIAGNOSIS — M25551 Pain in right hip: Secondary | ICD-10-CM | POA: Diagnosis not present

## 2023-09-27 DIAGNOSIS — M545 Low back pain, unspecified: Secondary | ICD-10-CM | POA: Diagnosis not present

## 2023-09-27 DIAGNOSIS — M533 Sacrococcygeal disorders, not elsewhere classified: Secondary | ICD-10-CM

## 2023-09-27 MED ORDER — MELOXICAM 15 MG PO TABS: 15.0000 mg | ORAL_TABLET | Freq: Every day | ORAL | 0 refills | Status: DC

## 2023-09-27 NOTE — Patient Instructions (Signed)
-   Start meloxicam 15 mg daily x2 weeks.  If still having pain after 2 weeks, complete 3rd-week of NSAID. May use remaining NSAID as needed once daily for pain control.  Do not to use additional over-the-counter NSAIDs (ibuprofen, naproxen, Advil, Aleve) while taking prescription NSAIDs.  May use Tylenol [phone] mg 2 to 3 times a day for breakthrough pain. Glute HEP/SI jt Referral to PT See me again in 5 weeks

## 2023-11-06 ENCOUNTER — Encounter: Payer: Self-pay | Admitting: Physical Therapy

## 2023-11-06 ENCOUNTER — Ambulatory Visit: Payer: BC Managed Care – PPO | Attending: Sports Medicine | Admitting: Physical Therapy

## 2023-11-06 DIAGNOSIS — M25551 Pain in right hip: Secondary | ICD-10-CM | POA: Insufficient documentation

## 2023-11-06 DIAGNOSIS — M25651 Stiffness of right hip, not elsewhere classified: Secondary | ICD-10-CM | POA: Diagnosis present

## 2023-11-06 DIAGNOSIS — M6281 Muscle weakness (generalized): Secondary | ICD-10-CM | POA: Diagnosis present

## 2023-11-06 DIAGNOSIS — M545 Low back pain, unspecified: Secondary | ICD-10-CM | POA: Insufficient documentation

## 2023-11-06 NOTE — Therapy (Signed)
 OUTPATIENT PHYSICAL THERAPY THORACOLUMBAR EVALUATION   Patient Name: Carrie Higgins MRN: [id number] DOB:1971-04-17, 53 y.o., female Today's Date: 11/07/2023  END OF SESSION:  PT End of Session - 11/06/23 1336     Visit Number 1    Number of Visits 6    Date for PT Re-Evaluation 12/18/23    Authorization Type BCBS    PT Start Time 1335    PT Stop Time 1419    PT Time Calculation (min) 44 min    Activity Tolerance Patient tolerated treatment well    Behavior During Therapy [facility] for tasks assessed/performed             Past Medical History:  Diagnosis Date   Anxiety    Environmental and seasonal allergies    Family history of adverse reaction to anesthesia    mother-- ponv   History of 2019 novel coronavirus disease (COVID-19) 12/30/2020   positive result in care everywhere,  per pt very mild symptoms that resolved   History of abnormal cervical Pap smear 2006   History of basal cell carcinoma (BCC)    per pt has had 3 excision's   Osteopenia    BMD age 23   Uterine fibroid    Past Surgical History:  Procedure Laterality Date   COLONOSCOPY WITH PROPOFOL   last one 01-23-2021   HYSTEROSCOPY WITH NOVASURE N/A 02/17/2021   Procedure: HYSTEROSCOPY WITH NOVASURE ABLATION, cervical polypectomy;  Surgeon: Cleotilde Ronal RAMAN, MD;  Location: [facility] [place];  Service: Gynecology;  Laterality: N/A;   Patient Active Problem List   Diagnosis Date Noted   History of endometrial ablation 09/05/2021   History of colonic polyps 09/05/2021   H/O LEEP 09/05/2021   Family history of colonic polyps 09/05/2021   Liver cyst 05/24/2021   Intramural uterine fibroid 02/07/2021   Dysplasia of cervix, low grade (CIN 1) 06/14/2016   ASCUS with positive high risk HPV 03/10/2015   Breast cyst 12/16/2013   Hematuria 10/03/2011   Allergic rhinitis 07/19/2011   Anxiety 07/19/2011    PCP: Jacques Camie Pepper PA-C  REFERRING PROVIDER: Leonce Katz DO   REFERRING DIAG: M54.50  (ICD-10-CM) - Lumbar spine pain M25.551 (ICD-10-CM) - Right hip pain  Rationale for Evaluation and Treatment: Rehabilitation  THERAPY DIAG:  Pain in right hip  Stiffness of right hip, not elsewhere classified  Muscle weakness (generalized)  ONSET DATE: 1 yr  SUBJECTIVE:  SUBJECTIVE STATEMENT: Pain has been > 1 yr, insidious onset.  She saw Dr. Leonce for Rt low back/hip pain which occasionally radiates to Rt lateral thigh. She reports that since she has been taking a break from her normal activities her pain is better. Her pain does not prevent her from doing things it just is uncomfortable. She feels better when she is moving.  She does report pain at the end of the day and early AM.  She has discomfort with sit to stand after sitting a bit. She did a class for the first time in a few weeks.  Normal fitness is walking and biking 3 miles/day, barre x 3 days per week . No concerning weakness, sensory symptoms.   PERTINENT HISTORY:  anxiety  PAIN:  Are you having pain? Yes: NPRS scale: 1/10 awareness, has been moderate Pain location: Rt hip , back  Pain description: annoying, sore, stiff  Aggravating factors: does not know the trigger Relieving factors: actually ignoring it has helped, theragun, stretching   PRECAUTIONS: None  RED FLAGS: None   WEIGHT BEARING RESTRICTIONS: No  FALLS:  Has patient fallen in last 6 months? No  LIVING ENVIRONMENT: Lives with: lives with their spouse and son  Lives in: House/apartment Stairs: Yes: Internal: 12 steps; on right going up and External: 3 steps; on right going up Has following equipment at home: None  OCCUPATION: Pt works as a technical brewer for [employer]   PLOF: Independent  PATIENT GOALS: Pt would like to be able to figure this out and  how to reduce the pain   NEXT MD VISIT: upcoming 5 week f/u may cancel to get some PT   OBJECTIVE:  Note: Objective measures were completed at Evaluation unless otherwise noted.  DIAGNOSTIC FINDINGS:  10/2023 : EXAM: DG HIP (WITH OR WITHOUT PELVIS) 3V RIGHT  COMPARISON:  None Available.  FINDINGS: There is no evidence of hip fracture or dislocation. There is no evidence of arthropathy or other focal bone abnormality. ...      XR 10/2023: FINDINGS: No fracture, dislocation or subluxation. No spondylolisthesis. No osteolytic or osteoblastic changes.   Degenerative disc disease noted with disc space narrowing and marginal osteophytes at L3-4.   IMPRESSION: Degenerative changes. No acute osseous abnormalities.   PATIENT SURVEYS:  FOTO 83% goal 85%  COGNITION: Overall cognitive status: Within functional limits for tasks assessed     SENSATION: WFL  MUSCLE LENGTH: Min tightness in hamstrings and quads, tight hip flexors   POSTURE:  none apparent   PALPATION: Min to no TTP along Rt lateral thigh, hip and lumbar spine Normal spinal mobility in thoracic and lumbar spine   LUMBAR ROM:   AROM eval  Flexion WNL discomfort  Extension WNL discomfort  Right lateral flexion WNL  Left lateral flexion WNL min tighter R side   Right rotation WNL  Left rotation WNL    (Blank rows = not tested)  LOWER EXTREMITY ROM:     Active  Right eval Left eval  Hip flexion    Hip extension    Hip abduction    Hip adduction    Hip internal rotation    Hip external rotation    Knee flexion    Knee extension    Ankle dorsiflexion    Ankle plantarflexion    Ankle inversion    Ankle eversion     (Blank rows = not tested)  LOWER EXTREMITY MMT:    MMT Right eval Left eval  Hip flexion  4+ 4+  Hip extension 4+ 4+  Hip abduction 4 4+  Hip adduction 4 4+  Hip internal rotation    Hip external rotation    Knee flexion 5 5  Knee extension 5 5  Ankle dorsiflexion 5 5  Ankle  plantarflexion    Ankle inversion    Ankle eversion     (Blank rows = not tested)  LUMBAR SPECIAL TESTS:  Straight leg raise test: Negative and Stork standing: Negative  FUNCTIONAL TESTS:  Single leg bridge 15 sec similar sx   SLS WNL with min trendelenburg R  GAIT: Distance walked: 150 Assistive device utilized: None Level of assistance: Modified independence Comments: none   TREATMENT DATE: 11/06/23                                                                                                                               [facility] Adult PT Treatment:                                                DATE: 11/06/23 Therapeutic Exercise: Single leg bridge Glute med hip drops  Verbal review of given HEP   Self Care: Differential diagnosis POC HEP Barre     PATIENT EDUCATION:  Education details: see self care  Person educated: Patient Education method: Programmer, Multimedia, Demonstration, Verbal cues, and Handouts Education comprehension: verbalized understanding and needs further education  HOME EXERCISE PROGRAM: Access Code: [email or web address] URL: [email or web address] Date: 11/06/2023 Prepared by: Delon Norma  Exercises - Hip Hiking on Step  - 1 x daily - 7 x weekly - 2 sets - 10 reps - 5 hold - Single Leg Stance  - 1 x daily - 7 x weekly - 1 sets - 3-5 reps - 30 hold - Sidelying Hip Abduction  - 1 x daily - 7 x weekly - 2 sets - 10 reps - 5 hold - Clamshell  - 1 x daily - 7 x weekly - 2 sets - 10 reps - 5 hold  ASSESSMENT:   CLINICAL IMPRESSION: Patient is a 53 y.o. female  who was seen today for physical therapy evaluation and treatment for Rt sided back and hip pain . Eval findings today are more consistent with glute med tendinopathy vs SIJ dysfunction.   OBJECTIVE IMPAIRMENTS: decreased mobility, decreased ROM, decreased strength, increased fascial restrictions, impaired flexibility, and pain.   ACTIVITY LIMITATIONS: lifting, standing, and locomotion  level  PARTICIPATION LIMITATIONS: community activity  PERSONAL FACTORS: Fitness and Time since onset of injury/illness/exacerbation are also affecting patient's functional outcome.   REHAB POTENTIAL: Excellent  CLINICAL DECISION MAKING: Stable/uncomplicated  EVALUATION COMPLEXITY: Low   GOALS: Goals reviewed with patient? Yes   LONG TERM GOALS: Target date: 12/18/2023    Pt will be I with HEP for hip and core strength and stability  Baseline:  unknown  Goal status: INITIAL  2.  Pt will be able to report pain in Rt hip no more than 2/10 at the end of a busy day and with transitions Baseline: can be 6/10 or more  Goal status: INITIAL  3.  Pt will demo 5/5 strength in R hip extension and abduction for optimal LE function Baseline: 4/5 to 4+/5 Goal status: INITIAL  4.  Pt will be able to walk 3 miles and report no more than min increased pain post walk (</= 3/10) Baseline: can be 6/10 or more  Goal status: INITIAL  5.  Pt will report no difficulty with home tasks due to no pain in Rt hip, back Baseline: min difficulty  Goal status: INITIAL  PLAN:  PT FREQUENCY: 1x/week  PT DURATION: 6 weeks  PLANNED INTERVENTIONS: 97164- PT Re-evaluation, 97110-Therapeutic exercises, 97530- Therapeutic activity, 97112- Neuromuscular re-education, 97535- Self Care, [id number]- Manual therapy, Patient/Family education, Balance training, Dry Needling, Joint mobilization, Spinal mobilization, Cryotherapy, and Moist heat.  PLAN FOR NEXT SESSION: check HEP, add core stability, LE strength, hip mobility    Megen Madewell, PT 11/07/2023, 7:40 AM    For all possible CPT codes, reference the Planned Interventions line above.     Check all conditions that are expected to impact treatment: {Conditions expected to impact treatment:None of these apply   If treatment provided at initial evaluation, no treatment charged due to lack of authorization.

## 2023-11-07 ENCOUNTER — Ambulatory Visit: Payer: BC Managed Care – PPO | Admitting: Sports Medicine

## 2023-11-15 ENCOUNTER — Ambulatory Visit: Payer: BC Managed Care – PPO

## 2023-12-06 ENCOUNTER — Encounter: Payer: BC Managed Care – PPO | Admitting: Physical Therapy

## 2024-06-17 ENCOUNTER — Other Ambulatory Visit: Payer: Self-pay | Admitting: Obstetrics & Gynecology

## 2024-06-17 DIAGNOSIS — Z1231 Encounter for screening mammogram for malignant neoplasm of breast: Secondary | ICD-10-CM

## 2024-07-23 ENCOUNTER — Other Ambulatory Visit: Payer: Self-pay | Admitting: Urology

## 2024-07-23 ENCOUNTER — Encounter: Payer: Self-pay | Admitting: Urology

## 2024-07-23 DIAGNOSIS — N281 Cyst of kidney, acquired: Secondary | ICD-10-CM

## 2024-07-29 ENCOUNTER — Ambulatory Visit
Admission: RE | Admit: 2024-07-29 | Discharge: 2024-07-29 | Disposition: A | Source: Ambulatory Visit | Attending: Urology

## 2024-07-29 DIAGNOSIS — N281 Cyst of kidney, acquired: Secondary | ICD-10-CM

## 2024-07-29 MED ORDER — GADOPICLENOL 0.5 MMOL/ML IV SOLN
6.5000 mL | Freq: Once | INTRAVENOUS | Status: AC | PRN
  Administered 2024-07-29: 6.5 mL via INTRAVENOUS

## 2024-08-06 ENCOUNTER — Ambulatory Visit
Admission: RE | Admit: 2024-08-06 | Discharge: 2024-08-06 | Disposition: A | Discharge status: Home / Self Care | Source: Ambulatory Visit | Attending: Obstetrics & Gynecology | Admitting: Obstetrics & Gynecology

## 2024-08-06 DIAGNOSIS — Z1231 Encounter for screening mammogram for malignant neoplasm of breast: Secondary | ICD-10-CM

## 2024-09-19 ENCOUNTER — Ambulatory Visit (HOSPITAL_BASED_OUTPATIENT_CLINIC_OR_DEPARTMENT_OTHER): Payer: BC Managed Care – PPO | Admitting: Obstetrics & Gynecology

## 2024-09-19 ENCOUNTER — Other Ambulatory Visit (HOSPITAL_COMMUNITY)
Admission: RE | Admit: 2024-09-19 | Discharge: 2024-09-19 | Disposition: A | Discharge status: Home / Self Care | Source: Ambulatory Visit | Attending: Obstetrics & Gynecology | Admitting: Obstetrics & Gynecology

## 2024-09-19 ENCOUNTER — Encounter (HOSPITAL_BASED_OUTPATIENT_CLINIC_OR_DEPARTMENT_OTHER): Payer: Self-pay | Admitting: Obstetrics & Gynecology

## 2024-09-19 VITALS — BP 109/64 | HR 85 | Ht 62.0 in | Wt 144.8 lb

## 2024-09-19 DIAGNOSIS — Z124 Encounter for screening for malignant neoplasm of cervix: Secondary | ICD-10-CM | POA: Diagnosis present

## 2024-09-19 DIAGNOSIS — D251 Intramural leiomyoma of uterus: Secondary | ICD-10-CM | POA: Diagnosis not present

## 2024-09-19 DIAGNOSIS — N870 Mild cervical dysplasia: Secondary | ICD-10-CM | POA: Diagnosis not present

## 2024-09-19 DIAGNOSIS — Z79890 Hormone replacement therapy (postmenopausal): Secondary | ICD-10-CM | POA: Diagnosis not present

## 2024-09-19 DIAGNOSIS — Z01419 Encounter for gynecological examination (general) (routine) without abnormal findings: Secondary | ICD-10-CM

## 2024-09-19 DIAGNOSIS — Z1331 Encounter for screening for depression: Secondary | ICD-10-CM | POA: Diagnosis not present

## 2024-09-19 MED ORDER — ESTRADIOL 0.0375 MG/24HR TD PTTW: 1.0000 | MEDICATED_PATCH | TRANSDERMAL | 5 refills | Status: AC

## 2024-09-19 MED ORDER — PROGESTERONE MICRONIZED 100 MG PO CAPS: 100.0000 mg | ORAL_CAPSULE | Freq: Every day | ORAL | 5 refills | Status: AC

## 2024-09-19 NOTE — Progress Notes (Addendum)
 ANNUAL EXAM Patient name: Carrie Higgins MRN [id number]  Date of birth: Jan 04, 1971 Chief Complaint:   Gynecologic Exam  History of Present Illness:   Carrie Higgins is a 53 y.o. G1P1 Caucasian female being seen today for a routine annual exam.  Having issues with plantar fasciitis that has been ongoing for several months.  She's also started having some issues with hives that are sporadic and in areas of where she will.  Has seen her dermatologist, Dr. Rolan Molt.  This confirmed a hives like reaction.  Has done allergy testing in the past.    Is having some hot flashes and night sweats.  These are manageable.  Not sure she wants to be on HRT.    No LMP recorded. Patient has had an ablation.  Last pap 09/02/2021. Results were: NILM w/ HRHPV negative. H/O abnormal pap: no Last mammogram: 08/06/2024. Results were: normal. Family h/o breast cancer: no Last colonoscopy: 01/22/2021. Results were: normal. Family h/o colorectal cancer: yes no but colon polyps with her father     09/19/2024    2:39 PM 09/14/2023    3:11 PM 09/05/2022    2:33 PM 06/07/2022    2:12 PM 09/02/2021    3:34 PM  Depression screen PHQ 2/9  Decreased Interest 0 0 0 0 0  Down, Depressed, Hopeless 0 0 0 0 0  PHQ - 2 Score 0 0 0 0 0    Review of Systems:   Pertinent items are noted in HPI Denies any bowel or bladder changes.  Denies pelvic pain.   Pertinent History Reviewed:  Reviewed past medical,surgical, social and family history.  Reviewed problem list, medications and allergies. Physical Assessment:   Vitals:   09/19/24 1437  BP: 109/64  Pulse: 85  SpO2: 100%  Weight: 144 lb 12.8 oz (65.7 kg)  Height: 5' 2 (1.575 m)  Body mass index is 26.48 kg/m.        Physical Examination:   General appearance - well appearing, and in no distress  Mental status - alert, oriented to person, place, and time  Psych:  She has a normal mood and affect  Skin - warm and dry, normal color, no suspicious lesions  noted  Chest - effort normal, all lung fields clear to auscultation bilaterally  Heart - normal rate and regular rhythm  Neck:  midline trachea, no thyromegaly or nodules  Breasts - breasts appear normal, no suspicious masses, no skin or nipple changes or  axillary nodes  Abdomen - soft, nontender, nondistended, no masses or organomegaly  Pelvic - VULVA: normal appearing vulva with no masses, tenderness or lesions   VAGINA: normal appearing vagina with normal color and discharge, no lesions   CERVIX: normal appearing cervix without discharge or lesions, no CMT  Thin prep pap is done with HR HPV cotesting  UTERUS: uterus is felt to be normal size, shape, consistency and nontender   ADNEXA: No adnexal masses or tenderness noted.  Rectal - normal rectal, good sphincter tone, no masses felt.   Extremities:  No swelling or varicosities noted  Chaperone present for exam  No results found for this or any previous visit (from the past 24 hours).  Assessment & Plan:  1. Well woman exam with routine gynecological exam (Primary) - Pap smear and HR HPV obtained today - Mammogram 07/2024 - Colonoscopy 2022 - Bone mineral density discussed.  Will plan closer to age 64. - lab work done with PCP - vaccines reviewed/updated  2.  Cervical cancer screening - Cytology - PAP( [facility])  3. Hormone replacement therapy (HRT) - estradiol (VIVELLE-DOT) 0.0375 MG/24HR; Place 1 patch onto the skin 2 (two) times a week.  Dispense: 8 patch; Refill: 5 - progesterone (PROMETRIUM) 100 MG capsule; Take 1 capsule (100 mg total) by mouth daily.  Dispense: 30 capsule; Refill: 5 - she is going to give me an update in 8 - 12 weeks - risks/side effects discussed  4. Intramural uterine fibroid  5. Dysplasia of cervix, low grade (CIN 1)   No orders of the defined types were placed in this encounter.   Meds:  Meds ordered this encounter  Medications   estradiol (VIVELLE-DOT) 0.0375 MG/24HR    Sig: Place 1  patch onto the skin 2 (two) times a week.    Dispense:  8 patch    Refill:  5   progesterone (PROMETRIUM) 100 MG capsule    Sig: Take 1 capsule (100 mg total) by mouth daily.    Dispense:  30 capsule    Refill:  5    Follow-up: Return in about 1 year (around 09/19/2025).  Ronal GORMAN Pinal, MD 09/19/2024 3:34 PM

## 2024-09-23 ENCOUNTER — Ambulatory Visit (HOSPITAL_BASED_OUTPATIENT_CLINIC_OR_DEPARTMENT_OTHER): Payer: Self-pay | Admitting: Obstetrics & Gynecology

## 2024-09-23 LAB — CYTOLOGY - PAP
Comment: NEGATIVE
Diagnosis: NEGATIVE
High risk HPV: NEGATIVE

## 2024-10-01 ENCOUNTER — Telehealth: Payer: Self-pay | Admitting: Internal Medicine

## 2024-10-01 NOTE — Telephone Encounter (Signed)
  Called and LM on vmail for patient to call back to schedule on 10/01/24.   Dr. Albertus sent me the following message to schedule an appointment: 3. Recommend referral to gastroenterology for further evaluation due to hepatomegaly More than likely not significant recommend further evaluation patient will see Dr. Albertus at [facility] can see an APP.

## 2024-11-21 ENCOUNTER — Other Ambulatory Visit (INDEPENDENT_AMBULATORY_CARE_PROVIDER_SITE_OTHER)

## 2024-11-21 ENCOUNTER — Ambulatory Visit: Admitting: Nurse Practitioner

## 2024-11-21 ENCOUNTER — Encounter: Payer: Self-pay | Admitting: Nurse Practitioner

## 2024-11-21 VITALS — BP 110/72 | HR 69 | Ht 62.0 in | Wt 143.5 lb

## 2024-11-21 DIAGNOSIS — R160 Hepatomegaly, not elsewhere classified: Secondary | ICD-10-CM | POA: Diagnosis not present

## 2024-11-21 DIAGNOSIS — Z860101 Personal history of adenomatous and serrated colon polyps: Secondary | ICD-10-CM

## 2024-11-21 LAB — CBC
HCT: 41 % (ref 36.0–46.0)
Hemoglobin: 14 g/dL (ref 12.0–15.0)
MCHC: 34.1 g/dL (ref 30.0–36.0)
MCV: 88.7 fl (ref 78.0–100.0)
Platelets: 216 K/uL (ref 150.0–400.0)
RBC: 4.62 Mil/uL (ref 3.87–5.11)
RDW: 13.1 % (ref 11.5–15.5)
WBC: 5.6 K/uL (ref 4.0–10.5)

## 2024-11-21 LAB — COMPREHENSIVE METABOLIC PANEL WITH GFR
ALT: 12 U/L (ref 3–35)
AST: 16 U/L (ref 5–37)
Albumin: 4.3 g/dL (ref 3.5–5.2)
Alkaline Phosphatase: 65 U/L (ref 39–117)
BUN: 19 mg/dL (ref 6–23)
CO2: 27 meq/L (ref 19–32)
Calcium: 9.5 mg/dL (ref 8.4–10.5)
Chloride: 105 meq/L (ref 96–112)
Creatinine, Ser: 0.72 mg/dL (ref 0.40–1.20)
GFR: 95.06 mL/min
Glucose, Bld: 102 mg/dL — ABNORMAL HIGH (ref 70–99)
Potassium: 3.9 meq/L (ref 3.5–5.1)
Sodium: 138 meq/L (ref 135–145)
Total Bilirubin: 0.3 mg/dL (ref 0.2–1.2)
Total Protein: 7.1 g/dL (ref 6.0–8.3)

## 2024-11-21 NOTE — Patient Instructions (Addendum)
 _______________________________________________________  If your blood pressure at your visit was 140/90 or greater, please contact your primary care physician to follow up on this.  _______________________________________________________  If you are age 54 or older, your body mass index should be between 23-30. Your Body mass index is 26.25 kg/m. If this is out of the aforementioned range listed, please consider follow up with your Primary Care Provider.  If you are age 65 or younger, your body mass index should be between 19-25. Your Body mass index is 26.25 kg/m. If this is out of the aformentioned range listed, please consider follow up with your Primary Care Provider.   ________________________________________________________  The [facility] providers would like to encourage you to use MYCHART to communicate with providers for non-urgent requests or questions.  Due to long hold times on the telephone, sending your provider a message by [facility] may be a faster and more efficient way to get a response.  Please allow 48 business hours for a response.  Please remember that this is for non-urgent requests.  _______________________________________________________  [facility] is using a team-based approach to care.  Your team is made up of your doctor and two to three APPS. Our APPS (Nurse Practitioners and Physician Assistants) work with your physician to ensure care continuity for you. They are fully qualified to address your health concerns and develop a treatment plan. They communicate directly with your gastroenterologist to care for you. Seeing the Advanced Practice Practitioners on your physician's team can help you by facilitating care more promptly, often allowing for earlier appointments, access to diagnostic testing, procedures, and other specialty referrals.   Your provider has requested that you go to the basement level for lab work before leaving today. Press B on the  elevator. The lab is located at the first door on the left as you exit the elevator.  REDUCE carbohydrates in diet.  Recommend losing 10 lbs.  Due to recent changes in healthcare laws, you may see the results of your imaging and laboratory studies on MyChart before your provider has had a chance to review them.  We understand that in some cases there may be results that are confusing or concerning to you. Not all laboratory results come back in the same time frame and the provider may be waiting for multiple results in order to interpret others.  Please give us  48 hours in order for your provider to thoroughly review all the results before contacting the office for clarification of your results.   Thank you for entrusting me with your care and choosing [facility].  Elida Shawl, NP

## 2024-11-21 NOTE — Progress Notes (Signed)
 "    11/21/2024 Carrie Higgins [id number] 1970-11-30  Primary Gastroenterologist: Dr. Albertus   Chief Complaint: Hepatomegaly  History of Present Illness: Carrie Higgins is a 54 year old female with a past medical history of anxiety, osteopenia right kidney complex cystic lesion, liver hemangioma and colon polyps.   Discussed the use of AI scribe software for clinical note transcription with the patient, who gave verbal consent to proceed.  History of Present Illness Carrie Higgins is a 54 year old female with renal and hepatic cysts who presents for evaluation of possible hepatomegaly noted on recent abdominal MRI.  Abdominal MRI performed in September 2025 for annual monitoring of a kidney cyst revealed an enlarged liver, a finding not previously noted on serial imaging. Prior studies, including MRIs from September 2024 and August 2023 and an earlier ultrasound, demonstrated liver lesion confirmed as a benign hemangioma, but the last two MRIs did not show any hemangioma-related liver lesions.  Laboratory studies, including AST, ALT, alkaline phosphatase, total bilirubin, albumin, and platelet count, have been consistently normal, with the most recent results from May 2025.   Recent lipid panel showed an LDL of 111 mg/dL, with other lipid values within normal limits.  Current weight is 143 lbs, representing a 15 lb increase since 2020, which she attributes to the COVID pandemic. She is actively trying to avoid further weight gain. Alcohol intake consists of approximately four glasses of white wine per week, consumed only on weekends.  Family history of liver disease is limited to her paternal grandmother, who had hepatitis of unknown type.  She denies having nausea or vomiting.  No abdominal pain.  Bowel movements are normal.  No bloody or black stools.  Her most recent colonoscopy was 12/2020 which identified one 6 mm sessile serrated polyp removed from the transverse colon.   Next colonoscopy due 02/2026.     Latest Ref Rng & Units 02/17/2021    8:52 AM 08/07/2018    9:22 AM 10/23/2013    9:49 AM  CBC  WBC 4.0 - 10.5 K/uL 5.3  6.4  5.5   Hemoglobin 12.0 - 15.0 g/dL 85.5  86.5  85.8   Hematocrit 36.0 - 46.0 % 43.6  41.1  40.8   Platelets 150 - 400 K/uL 189  220  208         Latest Ref Rng & Units 12/21/2021   11:07 AM 08/07/2018    9:22 AM 10/23/2013    9:49 AM  CMP  Glucose 65 - 99 mg/dL  75  87   BUN 6 - 24 mg/dL  13  11   Creatinine 9.42 - 1.00 mg/dL  9.27  9.27   Sodium 865 - 144 mmol/L  139  138   Potassium 3.5 - 5.2 mmol/L  3.9  3.6   Chloride 96 - 106 mmol/L  100  104   CO2 20 - 29 mmol/L  23  27   Calcium 8.7 - 10.2 mg/dL  9.2  8.8   Total Protein 6.0 - 8.3 g/dL 7.0  6.4  6.4   Total Bilirubin 0.2 - 1.2 mg/dL 0.5  0.3  0.5   Alkaline Phos 39 - 117 U/L 50  46  43   AST 0 - 37 U/L 15  14  16    ALT 0 - 35 U/L 11  14  11       Abdominal MRI 07/25/2024:  FINDINGS: Lower chest: No acute abnormality.   Hepatobiliary: No solid liver abnormality is seen.  Hepatomegaly, maximum coronal span 20.1 cm. No gallstones, gallbladder wall thickening, or biliary dilatation.   Pancreas: Unchanged small fatty interdigitation within the pancreatic neck (series 5, image 19). No pancreatic ductal dilatation or surrounding inflammatory changes.   Spleen: Normal in size without significant abnormality.   Adrenals/Urinary Tract: Adrenal glands are unremarkable. Unchanged complex cystic lesion arising from the inferior pole of the right kidney measuring 1.3 x 1.0 cm, again with perceptibly enhancing septation and eccentric wall thickening anteriorly (series 19, image 50). Left kidney is normal, without obvious renal calculi, solid lesion, or hydronephrosis.   Stomach/Bowel: Stomach is within normal limits. No evidence of bowel wall thickening, distention, or inflammatory changes.   Vascular/Lymphatic: No significant vascular findings are present.  No enlarged abdominal lymph nodes.   Other: No abdominal wall hernia or abnormality. No ascites.   Musculoskeletal: No acute or significant osseous findings. Unchanged benign enhancing vertebral body hemangioma of the right aspect of T11 (series 18, image 26).   IMPRESSION: 1. Unchanged complex cystic lesion arising from the inferior pole of the right kidney measuring 1.3 x 1.0 cm, again with perceptibly enhancing septation and eccentric wall thickening anteriorly. This remains consistent with a Bosniak category III lesion. 2. Hepatomegaly.  Abdominal MRI with and without contrast 07/31/2023: EXAM: MRI ABDOMEN WITHOUT AND WITH CONTRAST   TECHNIQUE: Multiplanar multisequence MR imaging of the abdomen was performed both before and after the administration of intravenous contrast.   CONTRAST:  7.5 cc Vueway    COMPARISON:  06/30/2022   FINDINGS: Lower chest: Unremarkable   Hepatobiliary: Benign 1.1 by 0.8 cm segment 2 cyst in the liver. Stable 1.0 by 0.8 by 0.7 cm lesion in the lateral segment left hepatic lobe on image 6 series 4 demonstrates low T1 and high T2 signal characteristics with diffuse arterial phase enhancement and at least some delayed enhancement, favoring a small hemangioma and without change over the last year.   Gallbladder unremarkable.  No biliary dilatation.   Pancreas: On image 26 of series 5 -1 there is a stable 8 by 5 mm focus of signal dropout in the pancreatic body favoring a small benign fatty lesion or fatty stippling along the pancreas. No worrisome pancreatic lesion observed.   Spleen:  Unremarkable   Adrenals/Urinary Tract: Benign 1.0 by 0.6 cm left kidney lower pole Bosniak category 2 cyst with a single thin internal septation   1.3 by 0.9 by 0.8 cm T2 hyperintense lesion of the right kidney lower pole observed on image 11 of series 3, with intermediate precontrast T1 signal characteristics and internal architecture demonstrating at  least 2 internal septations and some questionable peripheral nodularity on subtraction images I concur with the prior assessment this is an indeterminate (Bosniak category 3) cystic lesion. Size comparison of this lesion to previous is a little bit difficult due to motion artifact particularly on the previous exam.   Adrenal glands unremarkable.   Stomach/Bowel: Unremarkable   Vascular/Lymphatic:  Unremarkable   Other:  No supplemental non-categorized findings.   Musculoskeletal: Nonspecific 7 mm focus of enhancement eccentric to the right in the T11 vertebral body on image 16 of series 12, with low T1 and high T2 signal characteristics. This previously measured 6 mm in diameter. Although potentially an atypical hemangioma, this lesion is technically nonspecific.   IMPRESSION: 1. Stable Bosniak category 3 cystic lesion of the right kidney lower pole. Options include surveillance (6-12 months) or resection. 2. Stable small hemangioma in the lateral segment left hepatic lobe. 3. Stable small focus  of signal dropout in the pancreatic body favoring a small benign fatty lesion or fatty stippling along the pancreas. 4. Nonspecific 7 mm focus of enhancement eccentric to the right in the T11 vertebral body, previously 6 mm in diameter. Although potentially an atypical hemangioma, this lesion is technically nonspecific. Given the year of relative stability, surveillance suggested at follow up imaging.   Abdominal MRI 06/30/2022: 1. Small, complex cystic lesion of the inferior pole of the right kidney with thick, contrast enhancing septations measuring 1.1 x 1.0 cm. 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 Comparison to prior imaging demonstrating this lesion, if available, would be helpful to assess for interval change. Recommend follow-up at 6-12 months. 2. Additional simple, benign renal cortical cysts and  subcentimeter lesions too small to characterize although almost certainly additional benign cysts for which no specific further follow-up is required, Bosniak category I. 3. Benign cyst and hemangioma of the left lobe of the liver, for which no further follow-up is required.    GI PROCEDURES:    Colonoscopy 01/22/2021: - One 6 mm polyp in the distal transverse colon, removed with a cold snare. Resected and retrieved. - The examination was otherwise normal on direct and retroflexion views. - 5 year recall colonoscopy  Surgical [P], colon, transverse, polyp (1) - SESSILE SERRATED POLYP (X2 FRAGMENTS). - NO DYSPLASIA OR MALIGNANCY  Colonoscopy 11/09/2017: - Two 4 to 6 mm polyps in the cecum, removed with a cold snare. Resected and retrieved.  - Two 6 to 8 mm polyps in the ascending colon, removed with a cold snare. Resected and retrieved.  - The examination was otherwise normal on direct and retroflexion views.  1. Surgical [P], cecum, polyp (2) - TUBULAR ADENOMA (ONE FRAGMENT) WITHOUT HIGH GRADE DYSPLASIA OR MALIGNANCY. - HYPERPLASTIC POLYP (ONE FRAGMENT). 2. Surgical [P], ascending, polyp (2) - SESSILE SERRATED POLYP (TWELVE FRAGMENTS) WITHOUT CYTOLOGIC DYSPLASIA.  Past Medical History:  Diagnosis Date   Anxiety    Environmental and seasonal allergies    Family history of adverse reaction to anesthesia    mother-- ponv   History of 2019 novel coronavirus disease (COVID-19) 12/30/2020   positive result in care everywhere,  per pt very mild symptoms that resolved   History of abnormal cervical Pap smear 2006   History of basal cell carcinoma (BCC)    per pt has had 3 excision's   Osteopenia    BMD age 27   Uterine fibroid    Past Surgical History:  Procedure Laterality Date   COLONOSCOPY WITH PROPOFOL   last one 01-23-2021   HYSTEROSCOPY WITH NOVASURE N/A 02/17/2021   Procedure: HYSTEROSCOPY WITH NOVASURE ABLATION, cervical polypectomy;  Surgeon: Cleotilde Ronal RAMAN, MD;  Location: [facility];  Service: Gynecology;  Laterality: N/A;   Medications Ordered Prior to Encounter[1] Allergies[2]  Current Medications, Allergies, Past Medical History, Past Surgical History, Family History and Social History were reviewed in [employer] record.  Review of Systems:    Constitutional: Negative for fever, sweats, chills or weight loss. See HPI. Respiratory: Negative for shortness of breath.   Cardiovascular: Negative for chest pain, palpitations and leg swelling.  Gastrointestinal: See HPI.  Musculoskeletal: Negative for back pain or muscle aches.  Neurological: Negative for dizziness, headaches or paresthesias.   Physical Exam: BP 110/72   Pulse 69   Ht 5' 2 (1.575 m)   Wt 143 lb 8 oz (65.1 kg)  BMI 26.25 kg/m    Wt Readings from Last 3 Encounters:  09/19/24 144 lb 12.8 oz (65.7 kg)  09/27/23 141 lb (64 kg)  09/14/23 141 lb 6.4 oz (64.1 kg)    General: 54 year old female in no acute distress. Head: Normocephalic and atraumatic. Eyes: No scleral icterus. Conjunctiva pink . Ears: Normal auditory acuity. Mouth: Dentition intact. No ulcers or lesions.  Lungs: Clear throughout to auscultation. Heart: Regular rate and rhythm, no murmur. Abdomen: Soft, nontender and nondistended. No masses or hepatomegaly. Normal bowel sounds x 4 quadrants.  Rectal: Furred. Musculoskeletal: Symmetrical with no gross deformities. Extremities: No edema. Neurological: Alert oriented x 4. No focal deficits.  Psychological: Alert and cooperative. Normal mood and affect  Assessment and Recommendations:  54 year old female with a history of a complex cystic lesion to the right kidney, under annual surveillance imaging per her PCP.  Her most recent abdominal MRI 07/2024 identified hepatomegaly without liver lesions, gallstones or biliary ductal dilatation.  Normal LFTs, albumin and platelet levels.  Patient has gained 15 pounds since the COVID pandemic. - CMP,  CMP, hepatitis B surface antibody, hepatitis B core total antibody, hepatitis C antibody, hepatitis A total antibody, ANA, SMA, AMA and IgG level - After the patient was discharged from her office appointment today, I called [facility] and was able to speak with radiologist Dr. Kimberlee who reviewed the patient's liver measurements per MRI 05/2022 at 18.7, MRI 07/2023 at 20cm and  MRI 07/2024 at 18.9 cm.  In setting of normal LFTs without clinical signs of liver disease, Dr. Kimberlee indicated patient's mild hepatomegaly are likely an anatomical variant. - If the above lab results are unrevealing, I would not recommend any further hepatology serologies.  However, patient to continue routine labs including LFTs and CBC with her PCPon annual basis - I will consult with Dr. Albertus as well, await his input - Patient encouraged to lose 10 pounds, reduce carbohydrates in diet and exercise as tolerated  MRI 07/2024 showed unchanged small fatty interdigitation within the pancreatic neck. No pancreatic ductal dilatation or surrounding inflammatory changes.  Stable since MRI 07/2023.  History of colon polyps. Her most recent colonoscopy was 12/2020 which identified one 6 mm sessile serrated polyp removed from the transverse colon.   - Next colonoscopy due 02/2026.       [1]  Current Outpatient Medications on File Prior to Visit  Medication Sig Dispense Refill   estradiol  (VIVELLE -DOT) 0.0375 MG/24HR Place 1 patch onto the skin 2 (two) times a week. 8 patch 5   FLUoxetine (PROZAC) 20 MG capsule Take 20 mg by mouth at bedtime.     fluticasone (FLONASE) 50 MCG/ACT nasal spray Place into both nostrils as needed.     levocetirizine (XYZAL) 5 MG tablet Take 5 mg by mouth every evening.     LORazepam  (ATIVAN ) 0.5 MG tablet Take 1 tablet (0.5 mg total) by mouth as needed for anxiety. 20 tablet 0   progesterone  (PROMETRIUM ) 100 MG capsule Take 1 capsule (100 mg total) by mouth daily. 30 capsule 5   No current  facility-administered medications on file prior to visit.  [2]  Allergies Allergen Reactions   Levaquin [Levofloxacin] Other (See Comments)    numbness in arm and legs   Augmentin [Amoxicillin-Pot Clavulanate] Rash   Neosporin [Neomycin-Bacitracin Zn-Polymyx] Rash   "

## 2024-11-25 LAB — HEPATITIS B SURFACE ANTIGEN: Hepatitis B Surface Ag: NONREACTIVE

## 2024-11-25 LAB — ANTI-NUCLEAR AB-TITER (ANA TITER): ANA Titer 1: 1:40 {titer} — ABNORMAL HIGH

## 2024-11-25 LAB — HEPATITIS C ANTIBODY: Hepatitis C Ab: NONREACTIVE

## 2024-11-25 LAB — HEPATITIS A ANTIBODY, TOTAL: Hepatitis A AB,Total: NONREACTIVE

## 2024-11-25 LAB — ANTI-SMOOTH MUSCLE ANTIBODY, IGG: Actin (Smooth Muscle) Antibody (IGG): <20 U

## 2024-11-25 LAB — HEPATITIS B CORE ANTIBODY, TOTAL: Hep B Core Total Ab: NONREACTIVE

## 2024-11-25 LAB — IGG: IgG (Immunoglobin G), Serum: 929 mg/dL (ref 600–1640)

## 2024-11-25 LAB — MITOCHONDRIAL ANTIBODIES: Mitochondrial M2 Ab, IgG: <=20 U

## 2024-11-25 LAB — ANA: Anti Nuclear Antibody (ANA): POSITIVE — AB

## 2024-11-27 ENCOUNTER — Ambulatory Visit: Payer: Self-pay | Admitting: Nurse Practitioner

## 2024-12-03 NOTE — Progress Notes (Signed)
 Addendum: Reviewed and agree with assessment and management plan. Labs reviewed, liver enzymes very normal.  Mildly positive ANA of unclear consequence.  No evidence for autoimmune hepatitis. I agree with radiology that hepatomegaly call on most recent imaging is likely anatomic variant.  I do not think further imaging is necessary. Liver enzymes should be monitored routinely per primary care and reinvolvement with GI if they become abnormal or other symptoms develop Maryclaire Stoecker, Gordy HERO, MD

## 2024-12-06 NOTE — Progress Notes (Signed)
 Pt notified via mychart

## 2024-12-06 NOTE — Progress Notes (Signed)
 Please contact the patient and let her know Dr. Albertus reviewed her lab results and abdominal image studies.  He agreed with the radiologist that hepatomegaly result is likely an anatomical variant and no further imaging is required at this time.  Patient should continue routine labs including liver enzymes once yearly by her PCP.  Return to our office if she has future elevated LFTs or other GI/liver symptoms.  Thank you.
